# Patient Record
Sex: Female | Born: 1970
Health system: Southern US, Community
[De-identification: ages and names within clinical notes are randomized; demographics above are authoritative.]

## PROBLEM LIST (undated history)

## (undated) DIAGNOSIS — F17200 Nicotine dependence, unspecified, uncomplicated: Secondary | ICD-10-CM

## (undated) DIAGNOSIS — E1142 Type 2 diabetes mellitus with diabetic polyneuropathy: Secondary | ICD-10-CM

## (undated) DIAGNOSIS — E785 Hyperlipidemia, unspecified: Secondary | ICD-10-CM

## (undated) DIAGNOSIS — F1021 Alcohol dependence, in remission: Secondary | ICD-10-CM

## (undated) DIAGNOSIS — E118 Type 2 diabetes mellitus with unspecified complications: Secondary | ICD-10-CM

## (undated) HISTORY — DX: Type 2 diabetes mellitus with diabetic polyneuropathy: E11.42

## (undated) HISTORY — DX: Type 2 diabetes mellitus with unspecified complications: E11.8

## (undated) HISTORY — DX: Hyperlipidemia, unspecified: E78.5

## (undated) HISTORY — DX: Alcohol dependence, in remission: F10.21

## (undated) HISTORY — DX: Nicotine dependence, unspecified, uncomplicated: F17.200

## (undated) HISTORY — PX: OTHER SURGICAL HISTORY: SHX169

---

## 2007-03-10 ENCOUNTER — Emergency Department (HOSPITAL_COMMUNITY): Admission: EM | Admit: 2007-03-10 | Discharge: 2007-03-10 | Payer: Self-pay | Admitting: Emergency Medicine

## 2007-03-10 ENCOUNTER — Ambulatory Visit: Payer: Self-pay | Admitting: Psychiatry

## 2007-03-10 ENCOUNTER — Inpatient Hospital Stay (HOSPITAL_COMMUNITY): Admission: RE | Admit: 2007-03-10 | Discharge: 2007-03-17 | Payer: Self-pay | Admitting: Psychiatry
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
## 2014-03-21 ENCOUNTER — Emergency Department: Payer: Self-pay | Admitting: Internal Medicine

## 2014-03-21 LAB — COMPREHENSIVE METABOLIC PANEL
ALBUMIN: 4.3 g/dL (ref 3.4–5.0)
ALK PHOS: 92 U/L
Anion Gap: 3 — ABNORMAL LOW (ref 7–16)
BUN: 22 mg/dL — ABNORMAL HIGH (ref 7–18)
Bilirubin,Total: 0.4 mg/dL (ref 0.2–1.0)
Calcium, Total: 9.6 mg/dL (ref 8.5–10.1)
Chloride: 102 mmol/L (ref 98–107)
Co2: 27 mmol/L (ref 21–32)
Creatinine: 0.73 mg/dL (ref 0.60–1.30)
EGFR (Non-African Amer.): >60
Glucose: 292 mg/dL — ABNORMAL HIGH (ref 65–99)
Osmolality: 279 (ref 275–301)
POTASSIUM: 4.4 mmol/L (ref 3.5–5.1)
SGOT(AST): 23 U/L (ref 15–37)
SGPT (ALT): 19 U/L (ref 12–78)
Sodium: 132 mmol/L — ABNORMAL LOW (ref 136–145)
Total Protein: 7.9 g/dL (ref 6.4–8.2)

## 2014-03-21 LAB — DRUG SCREEN, URINE

## 2014-03-21 LAB — CBC
HCT: 43.6 % (ref 35.0–47.0)
HGB: 15 g/dL (ref 12.0–16.0)
MCH: 31.6 pg (ref 26.0–34.0)
MCHC: 34.4 g/dL (ref 32.0–36.0)
MCV: 92 fL (ref 80–100)
Platelet: 164 10*3/uL (ref 150–440)
RBC: 4.74 10*6/uL (ref 3.80–5.20)
RDW: 12.1 % (ref 11.5–14.5)
WBC: 11.1 10*3/uL — ABNORMAL HIGH (ref 3.6–11.0)

## 2014-03-21 LAB — URINALYSIS, COMPLETE
BILIRUBIN, UR: NEGATIVE
Blood: NEGATIVE
Ketone: NEGATIVE
Leukocyte Esterase: NEGATIVE
NITRITE: NEGATIVE
PH: 5 (ref 4.5–8.0)
Protein: NEGATIVE
RBC,UR: <1 /HPF (ref 0–5)
SQUAMOUS EPITHELIAL: NONE SEEN
Specific Gravity: 1.015 (ref 1.003–1.030)
WBC UR: <1 /HPF (ref 0–5)

## 2014-03-21 LAB — ETHANOL: Ethanol: <3 mg/dL

## 2014-03-21 LAB — SALICYLATE LEVEL: Salicylates, Serum: 4.8 mg/dL — ABNORMAL HIGH

## 2014-03-21 LAB — HEMOGLOBIN A1C: HEMOGLOBIN A1C: 9.9 % — AB (ref 4.2–6.3)

## 2014-03-21 LAB — ACETAMINOPHEN LEVEL

## 2015-02-15 NOTE — Consult Note (Signed)
PATIENT NAME:  Ashley Mcgee, [name] MR#:  [id number] DATE OF BIRTH:  September 02, 1971  DATE OF CONSULTATION:  03/22/2014  REFERRING PHYSICIAN:   CONSULTING PHYSICIAN:  Audery AmelJohn T. Nel Stoneking, MD  IDENTIFYING INFORMATION AND CHIEF COMPLAINT: A 44 year old woman who was brought to the Emergency Room by sheriff. She was petitioned by crisis services in the community because of suicidal ideation. The patient's chief complaint, "I've been depressed."   HISTORY OF PRESENT ILLNESS: The patient states that she has been feeling stressed out for many months. She does not have a job. Has major financial problems. Lives with her sister and feels like she is a burden and that she is not treated very well. Her mood stays down much of the time. Feels tired and worn out. She says that she has occasionally had passive suicidal thoughts but did not have any intent or plan to act on them. She denies that she is abusing drugs or alcohol. She has a history of alcohol dependence but has been sober now for an extended period of time without relapse. She contacted local mental health agencies seeking assistance. She was disappointed that they filed a commitment petition, saying that she would have been happy to come into the hospital by herself.   PAST PSYCHIATRIC HISTORY: Positive past history of alcohol dependence, but she is now maintaining extended sobriety. She had been prescribed medications in the past, including Celexa and Wellbutrin. She has made suicide attempts in the past, but the most recent one was 2004. Has not been hospitalized since that time. She denies any history of psychotic disorder.   SOCIAL HISTORY: No children. Not currently working. Lives with her sister. Has tried applying for disability, so far without success.   FAMILY HISTORY: Positive for depression.   MEDICAL HISTORY: Has diabetes. Does not take any medication, and her blood sugars have been out of control. She does not have any money or insurance and has not  accessed any care for it.   REVIEW OF SYSTEMS: Recent depressed mood, fatigue, feeling sick, feeling run down. Had occasional passive suicidal thoughts without any intent or plan. No psychotic symptoms. The rest of the review of systems unremarkable.   MENTAL STATUS EXAM: Slightly disheveled woman, looks her stated age, cooperative with the interview. Eye contact good. Psychomotor activity appropriate. Speech normal rate, tone and volume. Affect is expressive but appropriate, not out of control, not overly depressed. Mood stated as being okay. Thoughts are lucid. No loosening of associations. No sign of delusions. Denies auditory or visual hallucinations. Denies suicidal or homicidal ideation. Judgment and insight good. Short- and long-term memory intact. Alert and oriented x 4.   CURRENT MEDICATIONS: She was not taking any medications at all at home. Here in the hospital, she has been given metformin 500 mg twice a day and sliding scale insulin just because her blood sugar is so high.   ALLERGIES: No known drug allergies.   LABORATORY RESULTS: Blood sugars have been running up in the 300s since she came in. Her hemoglobin A1c is 9.9. Acetaminophen negative. Salicylates just slightly elevated but not toxic. Alcohol negative. Chemistry panel, besides the glucose, showed a low sodium part of which may be artifactual, elevated BUN at 22. CBC slightly elevated white count at 11.1. Urinalysis positive for glucose, otherwise unremarkable. Drug screen negative.   ASSESSMENT: A 44 year old woman who probably has a major depression, although her situation is very stressful and her diabetes is probably contributing to her mood. She is lucid and not psychotic.  She did not act on any thoughts of hurting herself. She currently denies any suicidal intent or plan. She is very enthusiastic and cooperative with the idea of going for outpatient mental health treatment. The patient does not require hospitalization and does  not meet commitment criteria.   TREATMENT PLAN: Discontinue involuntary commitment. The patient has been referred to [facility] and advised to go Monday morning for an evaluation for therapy and medication management there. She will be encouraged to go seek out what medical care there may be available from a clinic in the area because of her glucose.   DIAGNOSIS, PRINCIPAL AND PRIMARY:  AXIS I: Depression, not otherwise specified.   SECONDARY DIAGNOSES:  AXIS I: Alcohol dependence, in sustained remission.  AXIS II: Deferred.  AXIS III: Diabetes mellitus.  AXIS IV: Severe from financial problems.  AXIS V: Functioning at time of discharge 50.    ____________________________ Audery Amel, MD jtc:gb D: 03/22/2014 18:48:43 ET T: 03/22/2014 19:21:16 ET JOB#: [id number]  cc: Audery Amel, MD, <Dictator> Audery Amel MD ELECTRONICALLY SIGNED 04/10/2014 13:19

## 2015-09-16 ENCOUNTER — Emergency Department (INDEPENDENT_AMBULATORY_CARE_PROVIDER_SITE_OTHER)
Admission: EM | Admit: 2015-09-16 | Discharge: 2015-09-16 | Disposition: A | Discharge status: Home / Self Care | Payer: BLUE CROSS/BLUE SHIELD | Source: Home / Self Care

## 2015-09-16 ENCOUNTER — Encounter (HOSPITAL_COMMUNITY): Payer: Self-pay | Admitting: *Deleted

## 2015-09-16 DIAGNOSIS — J40 Bronchitis, not specified as acute or chronic: Secondary | ICD-10-CM | POA: Diagnosis not present

## 2015-09-16 MED ORDER — AZITHROMYCIN 250 MG PO TABS: 250.0000 mg | ORAL_TABLET | Freq: Every day | ORAL | Status: DC

## 2015-09-16 NOTE — ED Notes (Signed)
Pt  Is  A  Smoker     Reports  Coughing      As   Well   Some  Tightness  In  Her  Chest         Pt  Reports  Symptoms  For  About  1  Week       Pt  Sitting  Upright on the  Exam table  Speaking in  Complete  sentances

## 2015-09-16 NOTE — ED Provider Notes (Signed)
CSN: [id number]     Arrival date & time 09/16/15  1606 History   None    Chief Complaint  Patient presents with  . Cough   (Consider location/radiation/quality/duration/timing/severity/associated sxs/prior Treatment) HPI History obtained from patient:   LOCATION: Upper respiratory SEVERITY: No pain DURATION: Several days CONTEXT: Sudden onset due to exposure of sick people at work. QUALITY: Similar to previous bronchitis MODIFYING FACTORS: Over-the-counter medications without relief of symptoms ASSOCIATED SYMPTOMS: Cough sputum production or stiffness TIMING: Constant OCCUPATION: Telephone technical support  Past Medical History  Diagnosis Date  . Diabetes mellitus without complication (HCC)    History reviewed. No pertinent past surgical history. History reviewed. No pertinent family history. Social History  Substance Use Topics  . Smoking status: Current Every Day Smoker  . Smokeless tobacco: None  . Alcohol Use: No   OB History    No data available     Review of Systems ROS +'ve cough congestion  Denies: HEADACHE, NAUSEA, ABDOMINAL PAIN, CHEST PAIN, DYSURIA, SHORTNESS OF BREATH  Allergies  Review of patient's allergies indicates no known allergies.  Home Medications   Prior to Admission medications   Medication Sig Start Date End Date Taking? Authorizing Provider  azithromycin (ZITHROMAX) 250 MG tablet Take 1 tablet (250 mg total) by mouth daily. Take first 2 tablets together, then 1 every day until finished. 09/16/15   Tharon AquasFrank C Saachi Zale, PA   Meds Ordered and Administered this Visit  Medications - No data to display  BP 130/83 mmHg  Pulse 89  Temp(Src) 97.8 F (36.6 C) (Oral)  Resp 16  SpO2 99% No data found.   Physical Exam NURSES NOTES AND VITAL SIGNS REVIEWED. CONSTITUTIONAL: Well developed, well nourished, no acute distress HEENT: normocephalic, atraumatic EYES: Conjunctiva normal NECK:normal ROM, supple PULMONARY:No respiratory distress,  normal effort, Lungs: CTAb/l CARDIOVASCULAR: RRR, no murmur ABDOMEN: soft, ND, NT, +'ve BS MUSCULOSKELETAL: Normal ROM of all extremities SKIN: warm and dry without rash PSYCHIATRIC: Mood and affect normal  ED Course  Procedures (including critical care time)  Labs Review Labs Reviewed - No data to display  Imaging Review No results found.   Visual Acuity Review  Right Eye Distance:   Left Eye Distance:   Bilateral Distance:    Right Eye Near:   Left Eye Near:    Bilateral Near:         MDM   1. Bronchitis    Patient states that she would like to have an antibiotic. She admits to smoking approximately one pack of cigarettes a day. She states that in the past she is never required steroids or bronchodilators. She has had no hospitalizations for COPD or intubations or ICU level care. Patient is also requesting a return to work note. Prescription for azithromycin as prescribed. Instructions of care provided discharged home in stable condition.    Tharon AquasFrank C Benisha Hadaway, PA 09/16/15 1757

## 2015-09-16 NOTE — Discharge Instructions (Signed)
Upper Respiratory Infection, Adult °Most upper respiratory infections (URIs) are a viral infection of the air passages leading to the lungs. A URI affects the nose, throat, and upper air passages. The most common type of URI is nasopharyngitis and is typically referred to as "the common cold." °URIs run their course and usually go away on their own. Most of the time, a URI does not require medical attention, but sometimes a bacterial infection in the upper airways can follow a viral infection. This is called a secondary infection. Sinus and middle ear infections are common types of secondary upper respiratory infections. °Bacterial pneumonia can also complicate a URI. A URI can worsen asthma and chronic obstructive pulmonary disease (COPD). Sometimes, these complications can require emergency medical care and may be life threatening.  °CAUSES °Almost all URIs are caused by viruses. A virus is a type of germ and can spread from one person to another.  °RISKS FACTORS °You may be at risk for a URI if:  °· You smoke.   °· You have chronic heart or lung disease. °· You have a weakened defense (immune) system.   °· You are very young or very old.   °· You have nasal allergies or asthma. °· You work in crowded or poorly ventilated areas. °· You work in health care facilities or schools. °SIGNS AND SYMPTOMS  °Symptoms typically develop 2-3 days after you come in contact with a cold virus. Most viral URIs last 7-10 days. However, viral URIs from the influenza virus (flu virus) can last 14-18 days and are typically more severe. Symptoms may include:  °· Runny or stuffy (congested) nose.   °· Sneezing.   °· Cough.   °· Sore throat.   °· Headache.   °· Fatigue.   °· Fever.   °· Loss of appetite.   °· Pain in your forehead, behind your eyes, and over your cheekbones (sinus pain). °· Muscle aches.   °DIAGNOSIS  °Your health care provider may diagnose a URI by: °· Physical exam. °· Tests to check that your symptoms are not due to  another condition such as: °· Strep throat. °· Sinusitis. °· Pneumonia. °· Asthma. °TREATMENT  °A URI goes away on its own with time. It cannot be cured with medicines, but medicines may be prescribed or recommended to relieve symptoms. Medicines may help: °· Reduce your fever. °· Reduce your cough. °· Relieve nasal congestion. °HOME CARE INSTRUCTIONS  °· Take medicines only as directed by your health care provider.   °· Gargle warm saltwater or take cough drops to comfort your throat as directed by your health care provider. °· Use a warm mist humidifier or inhale steam from a shower to increase air moisture. This may make it easier to breathe. °· Drink enough fluid to keep your urine clear or pale yellow.   °· Eat soups and other clear broths and maintain good nutrition.   °· Rest as needed.   °· Return to work when your temperature has returned to normal or as your health care provider advises. You may need to stay home longer to avoid infecting others. You can also use a face mask and careful hand washing to prevent spread of the virus. °· Increase the usage of your inhaler if you have asthma.   °· Do not use any tobacco products, including cigarettes, chewing tobacco, or electronic cigarettes. If you need help quitting, ask your health care provider. °PREVENTION  °The best way to protect yourself from getting a cold is to practice good hygiene.  °· Avoid oral or hand contact with people with cold   symptoms.   °· Wash your hands often if contact occurs.   °There is no clear evidence that vitamin C, vitamin E, echinacea, or exercise reduces the chance of developing a cold. However, it is always recommended to get plenty of rest, exercise, and practice good nutrition.  °SEEK MEDICAL CARE IF:  °· You are getting worse rather than better.   °· Your symptoms are not controlled by medicine.   °· You have chills. °· You have worsening shortness of breath. °· You have brown or red mucus. °· You have yellow or brown nasal  discharge. °· You have pain in your face, especially when you bend forward. °· You have a fever. °· You have swollen neck glands. °· You have pain while swallowing. °· You have white areas in the back of your throat. °SEEK IMMEDIATE MEDICAL CARE IF:  °· You have severe or persistent: °¨ Headache. °¨ Ear pain. °¨ Sinus pain. °¨ Chest pain. °· You have chronic lung disease and any of the following: °¨ Wheezing. °¨ Prolonged cough. °¨ Coughing up blood. °¨ A change in your usual mucus. °· You have a stiff neck. °· You have changes in your: °¨ Vision. °¨ Hearing. °¨ Thinking. °¨ Mood. °MAKE SURE YOU:  °· Understand these instructions. °· Will watch your condition. °· Will get help right away if you are not doing well or get worse. °  °This information is not intended to replace advice given to you by your health care provider. Make sure you discuss any questions you have with your health care provider. °  °Document Released: 04/06/2001 Document Revised: 02/25/2015 Document Reviewed: 01/16/2014 °Elsevier Interactive Patient Education ©2016 [employer] °Acute Bronchitis °Bronchitis is when the airways that extend from the windpipe into the lungs get red, puffy, and painful (inflamed). Bronchitis often causes thick spit (mucus) to develop. This leads to a cough. A cough is the most common symptom of bronchitis. °In acute bronchitis, the condition usually begins suddenly and goes away over time (usually in 2 weeks). Smoking, allergies, and asthma can make bronchitis worse. Repeated episodes of bronchitis may cause more lung problems. °HOME CARE °· Rest. °· Drink enough fluids to keep your pee (urine) clear or pale yellow (unless you need to limit fluids as told by your doctor). °· Only take over-the-counter or prescription medicines as told by your doctor. °· Avoid smoking and secondhand smoke. These can make bronchitis worse. If you are a smoker, think about using nicotine gum or skin patches. Quitting smoking will help  your lungs heal faster. °· Reduce the chance of getting bronchitis again by: °¨ Washing your hands often. °¨ Avoiding people with cold symptoms. °¨ Trying not to touch your hands to your mouth, nose, or eyes. °· Follow up with your doctor as told. °GET HELP IF: °Your symptoms do not improve after 1 week of treatment. Symptoms include: °· Cough. °· Fever. °· Coughing up thick spit. °· Body aches. °· Chest congestion. °· Chills. °· Shortness of breath. °· Sore throat. °GET HELP RIGHT AWAY IF:  °· You have an increased fever. °· You have chills. °· You have severe shortness of breath. °· You have bloody thick spit (sputum). °· You throw up (vomit) often. °· You lose too much body fluid (dehydration). °· You have a severe headache. °· You faint. °MAKE SURE YOU:  °· Understand these instructions. °· Will watch your condition. °· Will get help right away if you are not doing well or get worse. °  °This information is   not intended to replace advice given to you by your health care provider. Make sure you discuss any questions you have with your health care provider. °  °Document Released: 03/29/2008 Document Revised: 06/13/2013 Document Reviewed: 04/03/2013 °Elsevier Interactive Patient Education ©2016 [employer] ° °

## 2018-07-14 ENCOUNTER — Encounter: Payer: Self-pay | Admitting: Family Medicine

## 2018-07-14 ENCOUNTER — Ambulatory Visit: Payer: Medicaid Other | Attending: Family Medicine | Admitting: Family Medicine

## 2018-07-14 VITALS — BP 113/69 | HR 82 | Temp 98.3°F | Ht 70.0 in | Wt 209.0 lb

## 2018-07-14 DIAGNOSIS — Z8249 Family history of ischemic heart disease and other diseases of the circulatory system: Secondary | ICD-10-CM | POA: Diagnosis not present

## 2018-07-14 DIAGNOSIS — Z23 Encounter for immunization: Secondary | ICD-10-CM | POA: Diagnosis not present

## 2018-07-14 DIAGNOSIS — Z79899 Other long term (current) drug therapy: Secondary | ICD-10-CM | POA: Diagnosis not present

## 2018-07-14 DIAGNOSIS — E119 Type 2 diabetes mellitus without complications: Secondary | ICD-10-CM | POA: Diagnosis present

## 2018-07-14 DIAGNOSIS — E785 Hyperlipidemia, unspecified: Secondary | ICD-10-CM | POA: Diagnosis not present

## 2018-07-14 DIAGNOSIS — I10 Essential (primary) hypertension: Secondary | ICD-10-CM

## 2018-07-14 DIAGNOSIS — E138 Other specified diabetes mellitus with unspecified complications: Secondary | ICD-10-CM

## 2018-07-14 DIAGNOSIS — F17210 Nicotine dependence, cigarettes, uncomplicated: Secondary | ICD-10-CM | POA: Diagnosis not present

## 2018-07-14 DIAGNOSIS — E1142 Type 2 diabetes mellitus with diabetic polyneuropathy: Secondary | ICD-10-CM

## 2018-07-14 DIAGNOSIS — Z833 Family history of diabetes mellitus: Secondary | ICD-10-CM | POA: Insufficient documentation

## 2018-07-14 LAB — POCT GLYCOSYLATED HEMOGLOBIN (HGB A1C): HbA1c, POC (controlled diabetic range): 5.6 % (ref 0.0–7.0)

## 2018-07-14 LAB — GLUCOSE, POCT (MANUAL RESULT ENTRY): POC Glucose: 192 mg/dL — AB (ref 70–99)

## 2018-07-14 MED ORDER — ATORVASTATIN CALCIUM 10 MG PO TABS: 10.0000 mg | ORAL_TABLET | Freq: Every day | ORAL | 3 refills | Status: DC

## 2018-07-14 MED ORDER — ACCU-CHEK AVIVA DEVI: 0 refills | Status: AC

## 2018-07-14 MED ORDER — METFORMIN HCL 1000 MG PO TABS: 1000.0000 mg | ORAL_TABLET | Freq: Two times a day (BID) | ORAL | 6 refills | Status: DC

## 2018-07-14 MED ORDER — GABAPENTIN 300 MG PO CAPS: 300.0000 mg | ORAL_CAPSULE | Freq: Three times a day (TID) | ORAL | 11 refills | Status: DC

## 2018-07-14 MED ORDER — GLUCOSE BLOOD VI STRP: ORAL_STRIP | 12 refills | Status: DC

## 2018-07-14 MED ORDER — LISINOPRIL 2.5 MG PO TABS: 2.5000 mg | ORAL_TABLET | Freq: Every day | ORAL | 6 refills | Status: DC

## 2018-07-14 MED ORDER — ACCU-CHEK SOFT TOUCH LANCETS MISC: 12 refills | Status: DC

## 2018-07-14 MED ORDER — GLIPIZIDE 10 MG PO TABS: 10.0000 mg | ORAL_TABLET | Freq: Two times a day (BID) | ORAL | 6 refills | Status: DC

## 2018-07-14 MED FILL — metFORMIN HCL 1000 MG TABS: 1000 | 30 days supply | Qty: 60 | Fill #0

## 2018-07-14 MED FILL — ATORVASTATIN 10 MG TABLET: 10 | 30 days supply | Qty: 30 | Fill #0

## 2018-07-14 MED FILL — GABAPENTIN 300 MG CAPSULE: 300 | 30 days supply | Qty: 90 | Fill #0

## 2018-07-14 MED FILL — glipiZIDE 10 MG TABS: 10 | 30 days supply | Qty: 60 | Fill #0

## 2018-07-14 MED FILL — ACCU-CHEK AVIVA PLUS W/DEVI: W/DEVICE | 30 days supply | Qty: 1 | Fill #0

## 2018-07-14 MED FILL — LISINOPRIL 2.5 MG TABLET: 2.5 | 30 days supply | Qty: 30 | Fill #0

## 2018-07-14 MED FILL — ACCU-CHEK AVIVA PLUS TEST S: 30 days supply | Qty: 100 | Fill #0

## 2018-07-14 MED FILL — ACCU-CHEK SOFTCLIX LANCETS: 30 days supply | Qty: 100 | Fill #0

## 2018-07-14 NOTE — Progress Notes (Signed)
Subjective:    Patient ID: Ashley AmassKristin Arya, female    DOB: 04/28/1971, 47 y.o.   MRN: [id number]  HPI 47 year old female who was seen to establish care for chronic medical issues including diabetes, hyperlipidemia and hypertension but patient states that she also sometimes becomes orthostatic.  Patient reports a history of alcohol abuse/alcoholism but states that she has been in recovery for the past 2 years.  Patient states that she was previously receiving services from a family medicine practitioner at family services but could no longer be seen there when she obtained insurance/Medicaid but is still followed by services for counseling and psychiatric services.  Patient states that she does check her blood sugars does need a new glucometer and supplies.  Patient states that her blood sugars are generally in the low 100s fasting and she considers him to be well controlled at this time.  Patient however does have peripheral neuropathy with burning and stinging as well as numbness from her lower legs above the ankles as well as her feet and toes.  Patient does continue to smoke about half a pack of cigarettes daily and has been smoking since the age of 47.  Patient denies any past surgical history.  Patient has had no allergic reactions to medications, foods or insects.  Patient reports a family history significant for her mother, father and both sets of grandparents with diabetes, hypertension and hyperlipidemia.  No family history of heart disease, stroke or cancers.      Patient reports that she feels well today.  Patient is currently on metformin and glipizide for control of her diabetes.  Patient does not have any issues with abdominal pain or diarrhea related to the use of these medication patient is also on gabapentin to help with neuropathic pain.  Patient is on a low dose of lisinopril for blood pressure.  Patient also takes atorvastatin for hyperlipidemia and denies any increased muscle aches with  the use of this medication.  Patient states she does have some fatigue.  Patient denies pain or palpitations.  Patient denies any current issues with shortness of breath or cough.  Patient reported no surgical history but later stated that she has had prior removal of cataracts.    Review of Systems  Constitutional: Negative for chills and fever.  HENT: Negative for congestion, sore throat and trouble swallowing.   Respiratory: Negative for cough and shortness of breath.   Cardiovascular: Negative for chest pain, palpitations and leg swelling.  Gastrointestinal: Negative for abdominal pain and nausea.  Genitourinary: Negative for dysuria and frequency.  Musculoskeletal: Negative for back pain and gait problem.  Neurological: Positive for numbness. Negative for dizziness and headaches.       Objective:   Physical Exam  Constitutional: She appears well-developed and well-nourished. No distress.  Neck: Normal range of motion. Neck supple. No thyromegaly present.  Cardiovascular: Normal rate and regular rhythm.  Pulses:      Dorsalis pedis pulses are 1+ on the right side, and 1+ on the left side.       Posterior tibial pulses are 1+ on the right side, and 1+ on the left side.  Pulmonary/Chest: Effort normal and breath sounds normal. She has no wheezes.  Abdominal: Soft. There is no tenderness.  Musculoskeletal: She exhibits no edema or tenderness.       Right foot: There is deformity (Hammertoe deformities). There is normal range of motion.       Left foot: There is deformity (Hammertoe deformities). There  is normal range of motion.  Feet:  Right Foot:  Protective Sensation: 10 sites tested. 2 sites sensed.  Skin Integrity: Negative for ulcer, blister, skin breakdown, erythema, warmth, callus or dry skin.  Left Foot:  Protective Sensation: 10 sites tested. 1 site sensed. Skin Integrity: Negative for ulcer, blister, skin breakdown, erythema, warmth, callus or dry skin.    Lymphadenopathy:    She has no cervical adenopathy.   BP 113/69   Pulse 82   Temp 98.3 F (36.8 C) (Oral)   Ht 5\' 10"  (1.778 m)   Wt 209 lb (94.8 kg)   SpO2 100%   BMI 29.99 kg/m Vital signs and nurse's notes reviewed      Assessment & Plan:  1. Diabetes mellitus of other type with complication, unspecified whether long term insulin use (HCC) Patient will have glucose and hemoglobin A1c done at today's visit.  Patient is provided with refills of her metformin and Glucotrol.  Patient will have CMP, TSH and urine microalbumin done at today's visit.  Patient states that her eye exam is up-to-date.  Patient is encouraged to monitor her blood sugars and new glucometer with twice daily supplies provided. - POCT glucose (manual entry) - POCT glycosylated hemoglobin (Hb A1C) - glipiZIDE (GLUCOTROL) 10 MG tablet; Take 1 tablet (10 mg total) by mouth 2 (two) times daily before a meal.  Dispense: 60 tablet; Refill: 6 - metFORMIN (GLUCOPHAGE) 1000 MG tablet; Take 1 tablet (1,000 mg total) by mouth 2 (two) times daily with a meal.  Dispense: 60 tablet; Refill: 6 - Comprehensive metabolic panel - TSH - Microalbumin/Creatinine Ratio, Urine - Blood Glucose Monitoring Suppl (ACCU-CHEK AVIVA) device; Use as instructed  Dispense: 1 each; Refill: 0 - glucose blood (ACCU-CHEK AVIVA) test strip; Use as instructed to check blood sugar 2-3 times per day  Dispense: 100 each; Refill: 12 - Lancets (ACCU-CHEK SOFT TOUCH) lancets; Use as instructed  Dispense: 100 each; Refill: 12  2. Diabetic polyneuropathy associated with type 2 diabetes mellitus (HCC) Patient with abnormal monofilament exam with no sensation on most of the areas of both feet.  Patient also reports neuropathic pain with burning and stinging.  Patient is provided with refill of gabapentin - gabapentin (NEURONTIN) 300 MG capsule; Take 1 capsule (300 mg total) by mouth 3 (three) times daily.  Dispense: 90 capsule; Refill: 11  3. Essential  hypertension Patient reports fluctuations in her blood pressure and states that she does have hypertension but occasionally becomes orthostatic.  Patient will continue use of low-dose lisinopril but also provide renal protection from the effects of diabetes. - lisinopril (PRINIVIL,ZESTRIL) 2.5 MG tablet; Take 1 tablet (2.5 mg total) by mouth daily.  Dispense: 30 tablet; Refill: 6  4. Hyperlipidemia, unspecified hyperlipidemia type Refill provided for atorvastatin as patient reports history of hyperlipidemia.  Patient is nonfasting at today's visit at home as results are not available.  Patient will hopefully be able to have fasting lipid panel before the end of the year/at her next visit. - atorvastatin (LIPITOR) 10 MG tablet; Take 1 tablet (10 mg total) by mouth daily.  Dispense: 90 tablet; Refill: 3  5. Encounter for long-term (current) use of medications Patient will have CMP and CBC done in follow-up of long-term use of medications for treatment of blood pressure, hypertension and hyperlipidemia patient is also on some psychiatric medications which can be considered high risk - Comprehensive metabolic panel - CBC with Differential  6. Need for immunization against influenza Patient was offered and agreed to  have influenza immunization at today's visit.  Information regarding influenza immunization was also provided - Flu Vaccine QUAD 36+ mos IM  *Patient did have some issues with resources such as transportation.  Patient states that she is a recovering alcoholic but has difficulty attending AA meetings secondary to issues with transportation.  Social worker was informed of patient's issues and will follow up patient for consultation as she was not able to see patient while she was here for today's visit.  An After Visit Summary was printed and given to the patient.  Return in about 4 months (around 11/13/2018) for and as needed.

## 2018-07-15 LAB — COMPREHENSIVE METABOLIC PANEL WITH GFR
ALT: 9 IU/L (ref 0–32)
AST: 10 IU/L (ref 0–40)
Albumin/Globulin Ratio: 1.9 (ref 1.2–2.2)
Albumin: 4.3 g/dL (ref 3.5–5.5)
Alkaline Phosphatase: 61 IU/L (ref 39–117)
BUN/Creatinine Ratio: 15 (ref 9–23)
BUN: 11 mg/dL (ref 6–24)
Bilirubin Total: 0.2 mg/dL (ref 0.0–1.2)
CO2: 24 mmol/L (ref 20–29)
Calcium: 9.4 mg/dL (ref 8.7–10.2)
Chloride: 91 mmol/L — ABNORMAL LOW (ref 96–106)
Creatinine, Ser: 0.74 mg/dL (ref 0.57–1.00)
GFR calc Af Amer: 110 mL/min/1.73
GFR calc non Af Amer: 95 mL/min/1.73
Globulin, Total: 2.3 g/dL (ref 1.5–4.5)
Glucose: 149 mg/dL — ABNORMAL HIGH (ref 65–99)
Potassium: 5.4 mmol/L — ABNORMAL HIGH (ref 3.5–5.2)
Sodium: 130 mmol/L — ABNORMAL LOW (ref 134–144)
Total Protein: 6.6 g/dL (ref 6.0–8.5)

## 2018-07-15 LAB — CBC WITH DIFFERENTIAL/PLATELET
Basophils Absolute: 0.1 x10E3/uL (ref 0.0–0.2)
Basos: 1 %
EOS (ABSOLUTE): 0.1 x10E3/uL (ref 0.0–0.4)
Eos: 1 %
Hematocrit: 33.9 % — ABNORMAL LOW (ref 34.0–46.6)
Hemoglobin: 11.5 g/dL (ref 11.1–15.9)
Immature Grans (Abs): 0 x10E3/uL (ref 0.0–0.1)
Immature Granulocytes: 0 %
Lymphocytes Absolute: 2.4 x10E3/uL (ref 0.7–3.1)
Lymphs: 32 %
MCH: 32.4 pg (ref 26.6–33.0)
MCHC: 33.9 g/dL (ref 31.5–35.7)
MCV: 96 fL (ref 79–97)
Monocytes Absolute: 0.4 x10E3/uL (ref 0.1–0.9)
Monocytes: 5 %
Neutrophils Absolute: 4.6 x10E3/uL (ref 1.4–7.0)
Neutrophils: 61 %
Platelets: 265 x10E3/uL (ref 150–450)
RBC: 3.55 x10E6/uL — ABNORMAL LOW (ref 3.77–5.28)
RDW: 14.3 % (ref 12.3–15.4)
WBC: 7.5 x10E3/uL (ref 3.4–10.8)

## 2018-07-15 LAB — MICROALBUMIN / CREATININE URINE RATIO
Creatinine, Urine: 37.4 mg/dL
Microalb/Creat Ratio: <8 mg/g{creat} (ref 0.0–30.0)
Microalbumin, Urine: <3 ug/mL

## 2018-07-15 LAB — TSH: TSH: 1.16 u[IU]/mL (ref 0.450–4.500)

## 2018-07-16 ENCOUNTER — Other Ambulatory Visit: Payer: Self-pay | Admitting: Family Medicine

## 2018-07-16 DIAGNOSIS — E875 Hyperkalemia: Secondary | ICD-10-CM

## 2018-07-16 DIAGNOSIS — E871 Hypo-osmolality and hyponatremia: Secondary | ICD-10-CM

## 2018-07-16 NOTE — Progress Notes (Signed)
Patient with recent blood work showing sodium of 130 and potassium that was slightly increased at 5.4.  Patient will be contacted by staff to return for lab only visit for recheck of BMP week.

## 2018-07-18 ENCOUNTER — Telehealth: Payer: Self-pay | Admitting: Licensed Clinical Social Worker

## 2018-07-18 NOTE — Telephone Encounter (Signed)
Call placed to patient. LCSWA introduced self and [facility]. LCSWA disclosed that she received a consult from Dr. Jillyn HiddenFulp to address transportation barriers.   Pt shared that she has Medicaid and plans on registering for Medicaid Transportation next week. She currently has a case Production designer, theatre/television/filmmanager through [employer] who has agreed to take pt to her local DSS office. Pt reports [facility] are expected to end this month due to pt's inability to obtain employment.  LCSWA informed pt about [employer]. Pt believes that she qualifies for services. LCSWA agreed to mail application to pt's address on file. [facility]. She will inquire whether therapist will assist with application and submission.   LCSWA provided additional information regarding supportive resources [facility]. Pt was strongly encouraged to contact LCSWA with any questions or concerns that may arise in the future. No additional concerns noted.

## 2018-11-13 ENCOUNTER — Encounter: Payer: Self-pay | Admitting: Family Medicine

## 2018-11-13 ENCOUNTER — Ambulatory Visit: Payer: Medicaid Other | Attending: Family Medicine | Admitting: Family Medicine

## 2018-11-13 VITALS — BP 110/69 | HR 90 | Temp 98.6°F | Resp 18 | Ht 70.0 in | Wt 201.0 lb

## 2018-11-13 DIAGNOSIS — E871 Hypo-osmolality and hyponatremia: Secondary | ICD-10-CM

## 2018-11-13 DIAGNOSIS — E785 Hyperlipidemia, unspecified: Secondary | ICD-10-CM

## 2018-11-13 DIAGNOSIS — I10 Essential (primary) hypertension: Secondary | ICD-10-CM | POA: Diagnosis not present

## 2018-11-13 DIAGNOSIS — Z8249 Family history of ischemic heart disease and other diseases of the circulatory system: Secondary | ICD-10-CM | POA: Diagnosis not present

## 2018-11-13 DIAGNOSIS — Z7984 Long term (current) use of oral hypoglycemic drugs: Secondary | ICD-10-CM | POA: Insufficient documentation

## 2018-11-13 DIAGNOSIS — E1165 Type 2 diabetes mellitus with hyperglycemia: Secondary | ICD-10-CM

## 2018-11-13 DIAGNOSIS — F1021 Alcohol dependence, in remission: Secondary | ICD-10-CM | POA: Diagnosis not present

## 2018-11-13 DIAGNOSIS — F17210 Nicotine dependence, cigarettes, uncomplicated: Secondary | ICD-10-CM

## 2018-11-13 DIAGNOSIS — R8290 Unspecified abnormal findings in urine: Secondary | ICD-10-CM | POA: Diagnosis not present

## 2018-11-13 DIAGNOSIS — E875 Hyperkalemia: Secondary | ICD-10-CM

## 2018-11-13 DIAGNOSIS — E1142 Type 2 diabetes mellitus with diabetic polyneuropathy: Secondary | ICD-10-CM | POA: Diagnosis not present

## 2018-11-13 DIAGNOSIS — E119 Type 2 diabetes mellitus without complications: Secondary | ICD-10-CM | POA: Diagnosis present

## 2018-11-13 DIAGNOSIS — Z79899 Other long term (current) drug therapy: Secondary | ICD-10-CM

## 2018-11-13 DIAGNOSIS — R319 Hematuria, unspecified: Secondary | ICD-10-CM | POA: Diagnosis not present

## 2018-11-13 DIAGNOSIS — Z833 Family history of diabetes mellitus: Secondary | ICD-10-CM | POA: Insufficient documentation

## 2018-11-13 DIAGNOSIS — Z720 Tobacco use: Secondary | ICD-10-CM

## 2018-11-13 LAB — POCT URINALYSIS DIP (CLINITEK)
Bilirubin, UA: NEGATIVE
Glucose, UA: 500 mg/dL — AB
Ketones, POC UA: NEGATIVE mg/dL
Leukocytes, UA: NEGATIVE
Nitrite, UA: NEGATIVE
POC PROTEIN,UA: 100 — AB
Spec Grav, UA: 1.01
Urobilinogen, UA: 0.2 U/dL
pH, UA: 5

## 2018-11-13 LAB — POCT GLYCOSYLATED HEMOGLOBIN (HGB A1C): Hemoglobin A1C: 9.9 % — AB (ref 4.0–5.6)

## 2018-11-13 LAB — GLUCOSE, POCT (MANUAL RESULT ENTRY)
POC Glucose: 308 mg/dL — AB (ref 70–99)
POC Glucose: 308 mg/dL — AB (ref 70–99)

## 2018-11-13 MED ORDER — ATORVASTATIN CALCIUM 10 MG PO TABS: 10.0000 mg | ORAL_TABLET | Freq: Every day | ORAL | 1 refills | Status: DC

## 2018-11-13 MED ORDER — METFORMIN HCL 1000 MG PO TABS: 1000.0000 mg | ORAL_TABLET | Freq: Two times a day (BID) | ORAL | 1 refills | Status: DC

## 2018-11-13 MED ORDER — INSULIN ASPART 100 UNIT/ML ~~LOC~~ SOLN
5.0000 [IU] | Freq: Once | SUBCUTANEOUS | Status: AC
  Administered 2018-11-13: 5 [IU] via SUBCUTANEOUS

## 2018-11-13 MED ORDER — GLIPIZIDE 10 MG PO TABS: 10.0000 mg | ORAL_TABLET | Freq: Two times a day (BID) | ORAL | 1 refills | Status: DC

## 2018-11-13 MED ORDER — LISINOPRIL 2.5 MG PO TABS: 2.5000 mg | ORAL_TABLET | Freq: Every day | ORAL | 1 refills | Status: DC

## 2018-11-13 MED ORDER — GABAPENTIN 300 MG PO CAPS: 300.0000 mg | ORAL_CAPSULE | Freq: Three times a day (TID) | ORAL | 1 refills | Status: DC

## 2018-11-13 MED FILL — metFORMIN HCL 1000 MG TABS: 1000 | 30 days supply | Qty: 60 | Fill #0

## 2018-11-13 MED FILL — LISINOPRIL 2.5 MG TABLET: 2.5 | 30 days supply | Qty: 30 | Fill #0

## 2018-11-13 MED FILL — ATORVASTATIN 10 MG TABLET: 10 | 30 days supply | Qty: 30 | Fill #0

## 2018-11-13 MED FILL — GABAPENTIN 300 MG CAPSULE: 300 | 30 days supply | Qty: 90 | Fill #0

## 2018-11-13 MED FILL — glipiZIDE 10 MG TABS: 10 | 30 days supply | Qty: 60 | Fill #0

## 2018-11-13 NOTE — Progress Notes (Signed)
Established Patient Office Visit  Subjective:  Patient ID: Ashley Mcgee, female    DOB: 10/03/1971  Age: 48 y.o. MRN: [id number]  CC:  Chief Complaint  Patient presents with  . Follow-up    DM    HPI Ashley Mcgee presents for follow-up of and continued management of chronic medical issues including type 2 diabetes, hyperlipidemia and history of hypertension complicated by occasional hypotension. Patient also with history of alcohol abuse which is currently in remission.  Patient also with painful peripheral neuropathy.  Patient also with a history of mental health issues for which she is also receiving care by another provider.  Patient also with tobacco use and continues to smoke.  At her last visit on 07/14/2018, patient's hemoglobin A1c was 5.6 and glucose on BMP was 149.  Patient also had elevated creatinine at 5.2 and hyponatremia with a sodium of 130.  Patient did not return for repeat labs as requested.  Patient also with normal CBC and TSH at her last visit.      At today's visit, patient states that she has been out of her diabetes medications for about a week but later states that she has difficulty with time and therefore it could have been 2 weeks or longer that she has been out of her diabetes medicine.  Patient reports that she continues to follow-up with family health services for mental health disorders.  Patient reports that she has been diagnosed with bipolar disorder and depression.  Patient feels that these issues are very well controlled on her current medications.  Patient denies any headaches or dizziness related to her blood pressure.  Patient denies shortness of breath or cough.  Patient continues to smoke approximately 1/2 pack/day of cigarettes.  Patient reports that she is not sure if she can stop smoking as patient used patches in the past without success.  Patient continues to have painful peripheral neuropathy in her feet and lower legs.  Patient states the  sensation is similar to what happens when your feet fall asleep.  Patient has painful numbness and tingling that is about a 7 on a 0-to-10 scale but patient states that this is improved with the use of gabapentin.  Patient states that she needs refills of all medications at today's visit.  Patient denies any muscle aches related to her use of cholesterol medication.  Patient is fasting at today's visit.  Past Medical History:  Diagnosis Date  . Alcohol dependence in remission (HCC)   . Diabetic peripheral neuropathy (HCC)   . Hyperlipidemia   . Tobacco dependence   . Type 2 diabetes with complication (HCC)     History reviewed. No pertinent surgical history.  Family History  Problem Relation Age of Onset  . Diabetes Mother   . Hypertension Mother   . Diabetes Father   . Hypertension Father     Social History   Socioeconomic History  . Marital status: Single    Spouse name: Not on file  . Number of children: Not on file  . Years of education: Not on file  . Highest education level: Not on file  Occupational History  . Not on file  Social Needs  . Financial resource strain: Not on file  . Food insecurity:    Worry: Not on file    Inability: Not on file  . Transportation needs:    Medical: Not on file    Non-medical: Not on file  Tobacco Use  . Smoking status: Current Every Day  Smoker    Packs/day: 0.50    Types: Cigarettes  . Smokeless tobacco: Never Used  Substance and Sexual Activity  . Alcohol use: No  . Drug use: Not Currently  . Sexual activity: Not Currently  Lifestyle  . Physical activity:    Days per week: Not on file    Minutes per session: Not on file  . Stress: Not on file  Relationships  . Social connections:    Talks on phone: Not on file    Gets together: Not on file    Attends religious service: Not on file    Active member of club or organization: Not on file    Attends meetings of clubs or organizations: Not on file    Relationship status:  Not on file  . Intimate partner violence:    Fear of current or ex partner: Not on file    Emotionally abused: Not on file    Physically abused: Not on file    Forced sexual activity: Not on file  Other Topics Concern  . Not on file  Social History Narrative  . Not on file    Outpatient Medications Prior to Visit  Medication Sig Dispense Refill  . Blood Glucose Monitoring Suppl (ACCU-CHEK AVIVA) device Use as instructed 1 each 0  . divalproex (DEPAKOTE) 500 MG DR tablet Take 500 mg by mouth 2 (two) times daily.    Marland Kitchen glucose blood (ACCU-CHEK AVIVA) test strip Use as instructed to check blood sugar 2-3 times per day 100 each 12  . Lancets (ACCU-CHEK SOFT TOUCH) lancets Use as instructed 100 each 12  . risperiDONE (RISPERDAL) 0.5 MG tablet Take 0.5 mg by mouth daily.    . risperiDONE (RISPERDAL) 2 MG tablet Take 2 mg by mouth at bedtime.    Marland Kitchen atorvastatin (LIPITOR) 10 MG tablet Take 1 tablet (10 mg total) by mouth daily. 90 tablet 3  . gabapentin (NEURONTIN) 300 MG capsule Take 1 capsule (300 mg total) by mouth 3 (three) times daily. 90 capsule 11  . glipiZIDE (GLUCOTROL) 10 MG tablet Take 1 tablet (10 mg total) by mouth 2 (two) times daily before a meal. 60 tablet 6  . lisinopril (PRINIVIL,ZESTRIL) 2.5 MG tablet Take 1 tablet (2.5 mg total) by mouth daily. 30 tablet 6  . metFORMIN (GLUCOPHAGE) 1000 MG tablet Take 1 tablet (1,000 mg total) by mouth 2 (two) times daily with a meal. 60 tablet 6  . azithromycin (ZITHROMAX) 250 MG tablet Take 1 tablet (250 mg total) by mouth daily. Take first 2 tablets together, then 1 every day until finished. 6 tablet 0   No facility-administered medications prior to visit.     No Known Allergies  ROS Review of Systems  Constitutional: Positive for fatigue. Negative for chills and fever.  HENT: Negative for sore throat and trouble swallowing.   Eyes: Negative for photophobia and visual disturbance.  Respiratory: Negative for cough and shortness of  breath.   Cardiovascular: Negative for chest pain, palpitations and leg swelling.  Gastrointestinal: Negative for abdominal pain, constipation, diarrhea and nausea.  Endocrine: Positive for polydipsia and polyuria. Negative for polyphagia.  Genitourinary: Positive for frequency (occasional). Negative for dysuria.  Musculoskeletal: Positive for arthralgias and gait problem.  Neurological: Positive for numbness. Negative for dizziness and headaches.  Hematological: Negative for adenopathy. Does not bruise/bleed easily.  Psychiatric/Behavioral: Positive for sleep disturbance. Negative for self-injury and suicidal ideas. The patient is nervous/anxious.       Objective:    Physical Exam  Constitutional: She is oriented to person, place, and time. She appears well-developed and well-nourished. No distress.  HENT:  Head: Normocephalic and atraumatic.  Right Ear: External ear normal.  Left Ear: External ear normal.  Nose: Nose normal.  Mouth/Throat: Oropharynx is clear and moist.  TMs normal bilaterally, patient with mild edema of the nasal mucosa with scant clear nasal discharge.  Patient with occasional sniffling while in the exam room.  Patient with mild posterior pharynx erythema.  Eyes: Conjunctivae and EOM are normal.  Neck: Normal range of motion. Neck supple. No JVD present. No thyromegaly present.  Cardiovascular: Normal rate, regular rhythm and normal heart sounds.  Patient with poorly palpable dorsalis pedis and posterior tibial pulses bilaterally  Pulmonary/Chest: Effort normal and breath sounds normal. No respiratory distress. She has no wheezes.  Heavy smell of tobacco/cigarette smoke on the patient and her clothing  Abdominal: Soft. She exhibits no distension. There is no abdominal tenderness. There is no rebound and no guarding.  Musculoskeletal: Normal range of motion.        General: Deformity (Hammertoe deformities on bilateral feet) present. No tenderness or edema.      Comments: Patient with some early hammertoe deformities on both feet  Lymphadenopathy:    She has no cervical adenopathy.  Neurological: She is alert and oriented to person, place, and time. No cranial nerve deficit.  Patient with abnormal diabetic foot exam with absence of sensation on the heels bilaterally.  Skin on the heels was dry and slightly thickened  Skin:  No ulcers,  erythema or skin breakdown on the feet.  Nails were within normal.  Patient with slightly thickened skin/mild calluses on the heels bilaterally  Psychiatric: She has a normal mood and affect. Her behavior is normal. Judgment and thought content normal.  Nursing note and vitals reviewed.   BP 110/69 (BP Location: Left Arm, Patient Position: Sitting, Cuff Size: Large)   Pulse 90   Temp 98.6 F (37 C) (Oral)   Resp 18   Ht 5\' 10"  (1.778 m)   Wt 201 lb (91.2 kg)   SpO2 99%   BMI 28.84 kg/m  Wt Readings from Last 3 Encounters:  11/13/18 201 lb (91.2 kg)  07/14/18 209 lb (94.8 kg)     Health Maintenance Due  Topic Date Due  . HIV Screening  11/25/1983  . TETANUS/TDAP  11/25/1987  . PAP SMEAR-Modifier  11/24/1989    There are no preventive care reminders to display for this patient.  Lab Results  Component Value Date   TSH 1.160 07/14/2018   Lab Results  Component Value Date   WBC 7.5 07/14/2018   HGB 11.5 07/14/2018   HCT 33.9 (L) 07/14/2018   MCV 96 07/14/2018   PLT 265 07/14/2018   Lab Results  Component Value Date   NA 130 (L) 07/14/2018   K 5.4 (H) 07/14/2018   CO2 24 07/14/2018   GLUCOSE 149 (H) 07/14/2018   BUN 11 07/14/2018   CREATININE 0.74 07/14/2018   BILITOT 0.2 07/14/2018   ALKPHOS 61 07/14/2018   AST 10 07/14/2018   ALT 9 07/14/2018   PROT 6.6 07/14/2018   ALBUMIN 4.3 07/14/2018   CALCIUM 9.4 07/14/2018   ANIONGAP 3 (L) 03/21/2014   No results found for: CHOL No results found for: HDL No results found for: LDLCALC No results found for: TRIG No results found for:  CHOLHDL     Assessment & Plan:  1. Diabetic polyneuropathy associated with type 2 diabetes mellitus (  HCC) Patient with poorly controlled diabetes with diabetic polyneuropathy.  Patient is provided with a refill of her gabapentin at today's visit.  Patient's hemoglobin A1c was done at today's visit and is elevated at 9.9.  Patient is provided with refills of her current medications as she has likely been out of the medicines for several weeks.  Patient's random blood sugar today's visit was elevated to 308.  Patient will also have lipid panel in follow-up of her hemoglobin A1c.  Urinalysis done by CMA per protocol due to patient's elevated blood sugar.Patient did not have any ketones on her urinalysis. - HgB A1c - Glucose (CBG) -CMET - POCT URINALYSIS DIP (CLINITEK) - gabapentin (NEURONTIN) 300 MG capsule; Take 1 capsule (300 mg total) by mouth 3 (three) times daily.  Dispense: 270 capsule; Refill: 1  2. Type 2 diabetes mellitus with hyperglycemia, without long-term current use of insulin (HCC) Due to patient's elevated blood sugar, patient was given 5 units of NovoLog at today's visit.  Patient also have repeat of her glucose.  Patient was provided with new prescriptions for her metformin and glipizide.  Patient is encouraged to start monitoring her blood sugars and call if her blood sugars are remaining greater than 160 fasting so that patient's medications can be adjusted to lower her A1c to goal of 7 or less if possible. - insulin aspart (novoLOG) injection 5 Units - Glucose (CBG) -CMET  3. Essential hypertension Patient's blood pressure is well controlled.  Refill of lisinopril 2.5 mg to help with protection of the kidneys against the effect of patient's poorly controlled diabetes. - lisinopril (PRINIVIL,ZESTRIL) 2.5 MG tablet; Take 1 tablet (2.5 mg total) by mouth daily.  Dispense: 90 tablet; Refill: 1  4. Hyperlipidemia, unspecified hyperlipidemia type Patient is currently on  atorvastatin for hyperlipidemia.  On review of her chart, I could not find a recent lipid panel.  Patient believes that she has not eaten prior to today's visit and patient will have lipid panel at today's visit.  Patient provided with refill of Lipitor 10 mg and patient will be notified if additional adjustment in medication is needed based on the results.  Patient is also encouraged to follow a low-fat diet and exercise as tolerated. - atorvastatin (LIPITOR) 10 MG tablet; Take 1 tablet (10 mg total) by mouth daily.  Dispense: 90 tablet; Refill: 1 - Lipid Panel  5. Hyponatremia Patient has had hyponatremia with sodium level of 130 on blood work done on 07/14/2018.  Patient will have BMP at today's visit in follow-up. -CMET  6. Hyperkalemia Patient will have BMP in follow-up of hyperkalemia with a potassium of 5.4 on blood work done 07/14/2018.  If patient's potassium remains elevated, patient is lisinopril will likely be stopped as this medication can contribute to hyperkalemia. -CMET  7. Encounter for long-term (current) use of medications Patient will have complete metabolic panel at today's visit in follow-up of her long-term use of medications for the treatment of diabetes, hypertension and hyperlipidemia. -CMET  8. Tobacco use .  The need for complete smoking cessation was discussed with the patient at today's visit and patient was made aware that resources are available through this clinic including scheduling a follow-up appointment with the clinical pharmacist here to discuss smoking cessation and medications are available to help with smoking cessation as well.  9. Abnormal urinalysis Patient with abnormal urinalysis with presence of hematuria and patient's urine will be sent for culture and patient will be notified if any further treatment is needed  based on her urine culture results - Urine Culture  An After Visit Summary was printed and given to the patient.  Follow-up: Return in  about 6 weeks (around 12/25/2018) for DM.    Cain Saupeammie Lari Linson, MD

## 2018-11-18 ENCOUNTER — Encounter: Payer: Self-pay | Admitting: Family Medicine

## 2018-12-21 LAB — HM DIABETES EYE EXAM

## 2018-12-25 ENCOUNTER — Ambulatory Visit: Payer: Medicaid Other | Attending: Family Medicine | Admitting: Family Medicine

## 2018-12-25 ENCOUNTER — Encounter: Payer: Self-pay | Admitting: Family Medicine

## 2018-12-25 VITALS — BP 107/71 | HR 77 | Temp 98.2°F | Resp 18 | Ht 70.0 in | Wt 201.0 lb

## 2018-12-25 DIAGNOSIS — Z7984 Long term (current) use of oral hypoglycemic drugs: Secondary | ICD-10-CM | POA: Insufficient documentation

## 2018-12-25 DIAGNOSIS — E871 Hypo-osmolality and hyponatremia: Secondary | ICD-10-CM

## 2018-12-25 DIAGNOSIS — E1165 Type 2 diabetes mellitus with hyperglycemia: Secondary | ICD-10-CM

## 2018-12-25 DIAGNOSIS — Z9841 Cataract extraction status, right eye: Secondary | ICD-10-CM | POA: Diagnosis not present

## 2018-12-25 DIAGNOSIS — F17210 Nicotine dependence, cigarettes, uncomplicated: Secondary | ICD-10-CM | POA: Diagnosis not present

## 2018-12-25 DIAGNOSIS — Z8249 Family history of ischemic heart disease and other diseases of the circulatory system: Secondary | ICD-10-CM | POA: Diagnosis not present

## 2018-12-25 DIAGNOSIS — E1142 Type 2 diabetes mellitus with diabetic polyneuropathy: Secondary | ICD-10-CM | POA: Diagnosis present

## 2018-12-25 DIAGNOSIS — E875 Hyperkalemia: Secondary | ICD-10-CM | POA: Diagnosis not present

## 2018-12-25 DIAGNOSIS — Z9842 Cataract extraction status, left eye: Secondary | ICD-10-CM | POA: Diagnosis not present

## 2018-12-25 DIAGNOSIS — Z833 Family history of diabetes mellitus: Secondary | ICD-10-CM | POA: Diagnosis not present

## 2018-12-25 DIAGNOSIS — I10 Essential (primary) hypertension: Secondary | ICD-10-CM

## 2018-12-25 DIAGNOSIS — Z79899 Other long term (current) drug therapy: Secondary | ICD-10-CM

## 2018-12-25 DIAGNOSIS — E785 Hyperlipidemia, unspecified: Secondary | ICD-10-CM

## 2018-12-25 LAB — POCT URINALYSIS DIP (CLINITEK)
Bilirubin, UA: NEGATIVE
Blood, UA: NEGATIVE
Glucose, UA: >=1000 mg/dL — AB
Ketones, POC UA: NEGATIVE mg/dL
Leukocytes, UA: NEGATIVE
Nitrite, UA: NEGATIVE
POC PROTEIN,UA: NEGATIVE
Spec Grav, UA: <=1.005 — AB
Urobilinogen, UA: 0.2 U/dL
pH, UA: 6

## 2018-12-25 LAB — GLUCOSE, POCT (MANUAL RESULT ENTRY): POC Glucose: 274 mg/dL — AB (ref 70–99)

## 2018-12-25 MED ORDER — GABAPENTIN 300 MG PO CAPS: 600.0000 mg | ORAL_CAPSULE | Freq: Three times a day (TID) | ORAL | 5 refills | Status: DC

## 2018-12-25 MED FILL — GABAPENTIN 300 MG CAPSULE: 300 | 30 days supply | Qty: 180 | Fill #0

## 2018-12-25 NOTE — Progress Notes (Signed)
Established Patient Office Visit  Subjective:  Patient ID: Ashley Mcgee, female    DOB: 1971-03-15  Age: 48 y.o. MRN: [id number]  CC: Follow-up of diabetes with neuropathy  HPI Eyva Heron presents for follow-up of diabetes with polyneuropathy for which she was seen on 11/13/2018. At her visit, patient had been out of her DM medications for a few weeks. Hgb A1c at that visit was elevated at 9.9. Random blood sugar was 308. Patient was restarted on her medications for her diabetes and gabapentin added for her peripheral neuropathy. Patient as also restarted on lisinopril for renal protection and Rx for lipitor for treatment of hyperlipidemia. She also had hematuria on UA.  Patient also encouraged to stop smoking and follow-up with her mental health provider for ongoing treatment for anxiety. Patient did not have labs ordered at her last visit and will obtain labs and repeat UA today.      He reports that she is taking her diabetes medication.  Patient however states that her blood sugars are running around 200 fasting.  She states that she only eats about 1 meal per day.  Patient is actually fasting today because she has not eaten since 4:00 pm yesterday.  On review of medicines, patient has been prescribed glipizide 10 mg twice daily but states that she is only taking it once daily as she has only been eating once per day.  Patient states that this is more of a habit and she believes that she can increase to 2 or more meals per day.  Patient does have some increased thirst related to her blood sugars but denies any urinary frequency and no blurred vision.  Patient reports that she had her diabetic eye exam done last week at [facility].  Patient reports history of cataract surgery bilaterally.      Patient denies any difficulty with lisinopril for renal protection.  No cough.  No headaches or dizziness related to blood pressure.  Patient denies any increased muscle pain related to her use of  atorvastatin for her cholesterol.  Patient continues to have chronic burning/sharp pain in both feet secondary to her diabetes.  Patient has been taking 300 mg twice daily but reports that she would occasionally take 600 mg for some doses which has caused her to run out of medication early.  Patient reports that without the medication her foot pain is a 10 on a 0-to-10 scale with 10 being the worst imaginable pain.  With the use of Neurontin her pain is decreased between a 4 and a 6 and she would like to have an increase in her dose to 2 pills 3 times daily.  Past Medical History:  Diagnosis Date  . Alcohol dependence in remission (HCC)   . Diabetic peripheral neuropathy (HCC)   . Hyperlipidemia   . Tobacco dependence   . Type 2 diabetes with complication (HCC)     No past surgical history on file.  Family History  Problem Relation Age of Onset  . Diabetes Mother   . Hypertension Mother   . Diabetes Father   . Hypertension Father     Social History   Socioeconomic History  . Marital status: Single    Spouse name: Not on file  . Number of children: Not on file  . Years of education: Not on file  . Highest education level: Not on file  Occupational History  . Not on file  Social Needs  . Financial resource strain: Not on file  .  Food insecurity:    Worry: Not on file    Inability: Not on file  . Transportation needs:    Medical: Not on file    Non-medical: Not on file  Tobacco Use  . Smoking status: Current Every Day Smoker    Packs/day: 0.50    Types: Cigarettes  . Smokeless tobacco: Never Used  Substance and Sexual Activity  . Alcohol use: No  . Drug use: Not Currently  . Sexual activity: Not Currently  Lifestyle  . Physical activity:    Days per week: Not on file    Minutes per session: Not on file  . Stress: Not on file  Relationships  . Social connections:    Talks on phone: Not on file    Gets together: Not on file    Attends religious service: Not on file     Active member of club or organization: Not on file    Attends meetings of clubs or organizations: Not on file    Relationship status: Not on file  . Intimate partner violence:    Fear of current or ex partner: Not on file    Emotionally abused: Not on file    Physically abused: Not on file    Forced sexual activity: Not on file  Other Topics Concern  . Not on file  Social History Narrative  . Not on file    Outpatient Medications Prior to Visit  Medication Sig Dispense Refill  . atorvastatin (LIPITOR) 10 MG tablet Take 1 tablet (10 mg total) by mouth daily. 90 tablet 1  . Blood Glucose Monitoring Suppl (ACCU-CHEK AVIVA) device Use as instructed 1 each 0  . divalproex (DEPAKOTE) 500 MG DR tablet Take 500 mg by mouth 2 (two) times daily.    Marland Kitchen gabapentin (NEURONTIN) 300 MG capsule Take 1 capsule (300 mg total) by mouth 3 (three) times daily. 270 capsule 1  . glipiZIDE (GLUCOTROL) 10 MG tablet Take 1 tablet (10 mg total) by mouth 2 (two) times daily before a meal. 180 tablet 1  . glucose blood (ACCU-CHEK AVIVA) test strip Use as instructed to check blood sugar 2-3 times per day 100 each 12  . Lancets (ACCU-CHEK SOFT TOUCH) lancets Use as instructed 100 each 12  . lisinopril (PRINIVIL,ZESTRIL) 2.5 MG tablet Take 1 tablet (2.5 mg total) by mouth daily. 90 tablet 1  . metFORMIN (GLUCOPHAGE) 1000 MG tablet Take 1 tablet (1,000 mg total) by mouth 2 (two) times daily with a meal. 180 tablet 1  . risperiDONE (RISPERDAL) 0.5 MG tablet Take 0.5 mg by mouth daily.    . risperiDONE (RISPERDAL) 2 MG tablet Take 2 mg by mouth at bedtime.     No facility-administered medications prior to visit.     No Known Allergies  ROS Review of Systems  Constitutional: Positive for fatigue (improving). Negative for chills and fever.  HENT: Negative for sore throat and trouble swallowing.   Eyes: Negative for photophobia and visual disturbance.  Respiratory: Negative for cough and shortness of breath.     Cardiovascular: Negative for chest pain, palpitations and leg swelling.  Gastrointestinal: Negative for abdominal pain, blood in stool, constipation, diarrhea and nausea.  Endocrine: Positive for polydipsia. Negative for polyphagia and polyuria.  Genitourinary: Negative for dysuria and frequency.  Musculoskeletal: Positive for gait problem (foot pain due to diabetic neuropathy). Negative for arthralgias and back pain.  Neurological: Negative for dizziness and headaches.  Hematological: Negative for adenopathy. Does not bruise/bleed easily.  Psychiatric/Behavioral: Negative for  self-injury and suicidal ideas. The patient is nervous/anxious (on medication).       Objective:    Physical Exam BP 107/71 (BP Location: Left Arm, Patient Position: Sitting, Cuff Size: Normal)   Pulse 77   Temp 98.2 F (36.8 C) (Oral)   Resp 18   Ht 5\' 10"  (1.778 m)   Wt 201 lb (91.2 kg)   SpO2 99%   BMI 28.84 kg/m  Nurse's notes and vital signs reviewed  General-well-nourished, well-developed overweight for height female in no acute distress Neck-supple, no lymphadenopathy, no thyromegaly, no carotid bruit Lungs-clear to auscultation bilaterally, breathing is nonlabored Cardiovascular-regular rate and regular rhythm Abdomen-soft, nontender Back-no CVA tenderness Extremities-no edema Psych-normal mood and judgment  Wt Readings from Last 3 Encounters:  11/13/18 201 lb (91.2 kg)  07/14/18 209 lb (94.8 kg)     Health Maintenance Due  Topic Date Due  . HIV Screening  11/25/1983  . TETANUS/TDAP  11/25/1987  . PAP SMEAR-Modifier  11/24/1989  . MAMMOGRAM  11/24/2018  . COLONOSCOPY  11/24/2018    There are no preventive care reminders to display for this patient.  Lab Results  Component Value Date   TSH 1.160 07/14/2018   Lab Results  Component Value Date   WBC 7.5 07/14/2018   HGB 11.5 07/14/2018   HCT 33.9 (L) 07/14/2018   MCV 96 07/14/2018   PLT 265 07/14/2018   Lab Results   Component Value Date   NA 130 (L) 07/14/2018   K 5.4 (H) 07/14/2018   CO2 24 07/14/2018   GLUCOSE 149 (H) 07/14/2018   BUN 11 07/14/2018   CREATININE 0.74 07/14/2018   BILITOT 0.2 07/14/2018   ALKPHOS 61 07/14/2018   AST 10 07/14/2018   ALT 9 07/14/2018   PROT 6.6 07/14/2018   ALBUMIN 4.3 07/14/2018   CALCIUM 9.4 07/14/2018   ANIONGAP 3 (L) 03/21/2014   No results found for: CHOL Lipid panel will be done at today's visit No results found for: HDL No results found for: LDLCALC No results found for: TRIG No results found for: [facility] Lab Results  Component Value Date   HGBA1C 9.9 (A) 11/13/2018      Assessment & Plan:  1. Poorly controlled type 2 diabetes mellitus with peripheral neuropathy (HCC) Patient's most recent hemoglobin A1c was elevated at 9.9.  Patient reports that her fasting blood sugars are still in the 200s.  Patient states that she has only been eating once per day.  Patient was encouraged to eat more than once a day by increasing to twice daily and eventually going to mail at least every 6-8 hours.  Patient has only been taking the prescribed glipizide once daily and patient will start taking the medication twice daily with a meal in addition to metformin.  I believe that this should help bring her blood sugars closer to goal of 140 or less fasting.  Repeat urinalysis done at today's visit as patient had hematuria on last urinalysis.  Patient denies any urinary symptoms at today's visit.  Patient's dose of gabapentin will be increased to 600 mg 3 times daily to help with peripheral neuropathic symptoms.  Diabetic foot care again discussed.  Diabetic foot exam was done at her last visit.  Patient reports that she had diabetic eye exam done last week. - POCT URINALYSIS DIP (CLINITEK) - Glucose (CBG) - gabapentin (NEURONTIN) 300 MG capsule; Take 2 capsules (600 mg total) by mouth 3 (three) times daily.  Dispense: 270 capsule; Refill: 5  2.  Essential hypertension Blood  pressures controlled with the use of lisinopril which is also given for renal protection.  Patient will have CMP as she has had issues with potassium and sodium in the past. - Comprehensive metabolic panel  3. Hyperlipidemia, unspecified hyperlipidemia type Patient is currently on atorvastatin 10 mg daily and patient is encouraged to continue a low-fat diet.  Patient will have lipid panel at today's visit as well as CMP in follow-up of use of medications for treatment of diabetes, hypertension and hyperlipidemia. - Lipid panel  4. Hyponatremia Discussed with the patient that she has had past issues with hyponatremia and that low sodium can cause dizziness/loss of balance as well as more serious issues.  Patient will have sodium level checked as part of her CMP  5. Hyperkalemia Patient has had prior mild increase in potassium level and patient is on lisinopril which can also increase potassium.  Patient will have potassium level rechecked as part of CMP and will be notified if any changes are needed.  6.  Long-term use of medications Patient is on several medications some of which are considered high risk such as her atorvastatin which can increase LFTs and patient also with use of atypical antipsychotics per mental health.  Patient will have CMP in follow-up with medications  An After Visit Summary was printed and given to the patient. Allergies as of 12/25/2018   No Known Allergies     Medication List       Accurate as of December 25, 2018  2:36 PM. Always use your most recent med list.        ACCU-CHEK AVIVA device Use as instructed   accu-chek soft touch lancets Use as instructed   atorvastatin 10 MG tablet Commonly known as:  LIPITOR Take 1 tablet (10 mg total) by mouth daily.   divalproex 500 MG DR tablet Commonly known as:  DEPAKOTE Take 500 mg by mouth 2 (two) times daily.   gabapentin 300 MG capsule Commonly known as:  NEURONTIN Take 2 capsules (600 mg total) by mouth 3  (three) times daily.   glipiZIDE 10 MG tablet Commonly known as:  GLUCOTROL Take 1 tablet (10 mg total) by mouth 2 (two) times daily before a meal.   glucose blood test strip Commonly known as:  ACCU-CHEK AVIVA Use as instructed to check blood sugar 2-3 times per day   lisinopril 2.5 MG tablet Commonly known as:  PRINIVIL,ZESTRIL Take 1 tablet (2.5 mg total) by mouth daily.   metFORMIN 1000 MG tablet Commonly known as:  GLUCOPHAGE Take 1 tablet (1,000 mg total) by mouth 2 (two) times daily with a meal.   risperiDONE 0.5 MG tablet Commonly known as:  RISPERDAL Take 0.5 mg by mouth daily.   risperiDONE 2 MG tablet Commonly known as:  RISPERDAL Take 2 mg by mouth at bedtime.       Follow-up: Return in about 3 months (around 03/27/2019) for DM.    Cain Saupeammie Julieanna Geraci, MD

## 2018-12-26 LAB — LIPID PANEL
Chol/HDL Ratio: 6.7 ratio — ABNORMAL HIGH (ref 0.0–4.4)
Cholesterol, Total: 302 mg/dL — ABNORMAL HIGH (ref 100–199)
HDL: 45 mg/dL
LDL Calculated: 184 mg/dL — ABNORMAL HIGH (ref 0–99)
Triglycerides: 364 mg/dL — ABNORMAL HIGH (ref 0–149)
VLDL Cholesterol Cal: 73 mg/dL — ABNORMAL HIGH (ref 5–40)

## 2018-12-26 LAB — COMPREHENSIVE METABOLIC PANEL WITH GFR
ALT: 18 IU/L (ref 0–32)
AST: 14 IU/L (ref 0–40)
Albumin/Globulin Ratio: 1.8 (ref 1.2–2.2)
Albumin: 4.4 g/dL (ref 3.8–4.8)
Alkaline Phosphatase: 85 IU/L (ref 39–117)
BUN/Creatinine Ratio: 17 (ref 9–23)
BUN: 12 mg/dL (ref 6–24)
Bilirubin Total: 0.4 mg/dL (ref 0.0–1.2)
CO2: 24 mmol/L (ref 20–29)
Calcium: 9.2 mg/dL (ref 8.7–10.2)
Chloride: 96 mmol/L (ref 96–106)
Creatinine, Ser: 0.7 mg/dL (ref 0.57–1.00)
GFR calc Af Amer: 117 mL/min/1.73
GFR calc non Af Amer: 101 mL/min/1.73
Globulin, Total: 2.5 g/dL (ref 1.5–4.5)
Glucose: 281 mg/dL — ABNORMAL HIGH (ref 65–99)
Potassium: 4.2 mmol/L (ref 3.5–5.2)
Sodium: 137 mmol/L (ref 134–144)
Total Protein: 6.9 g/dL (ref 6.0–8.5)

## 2019-01-02 ENCOUNTER — Other Ambulatory Visit: Payer: Self-pay | Admitting: Family Medicine

## 2019-01-02 DIAGNOSIS — E785 Hyperlipidemia, unspecified: Secondary | ICD-10-CM

## 2019-01-02 MED ORDER — ATORVASTATIN CALCIUM 40 MG PO TABS: 40.0000 mg | ORAL_TABLET | Freq: Every day | ORAL | 5 refills | Status: DC

## 2019-01-02 NOTE — Progress Notes (Signed)
Patient ID: Ashley Mcgee, female   DOB: 10-05-1971, 48 y.o.   MRN: [id number]   Patient with recent labs showing LDL of 184 and atorvastatin dose will be increased to 40 mg daily. Patient will be notified and new RX will be sent to pharmacy

## 2019-01-03 ENCOUNTER — Telehealth: Payer: Self-pay | Admitting: *Deleted

## 2019-01-03 NOTE — Telephone Encounter (Signed)
-----   Message from Cain Saupe, MD sent at 01/02/2019  9:25 PM EDT ----- On complete metabolic panel, glucose is elevated at 281 but otherwise normal. Cholesterol is high with a total cholesterol of 302, TG of 364 (normal 150 or less), LDL is 184 with goal being 70 or less. Please let patient know that a higher dose of her atorvastatin will be sent to her pharmacy (she is currently on 10 mg and this will be increased to 40 mg). She should also follow a low carb and a low fat diet

## 2019-01-03 NOTE — Telephone Encounter (Signed)
Patient verified DOB Patient is aware of CMP being normal and needing to adhere to DM medications for elevated glucose. Patient is also aware of cholesterol being elevated and needing to start 40 mg of atorvastatin daily instead of the current 10. No further questions at this time.

## 2019-01-30 MED FILL — GABAPENTIN 300 MG CAPSULE: 300 | 30 days supply | Qty: 180 | Fill #1

## 2019-02-26 MED FILL — GABAPENTIN 300 MG CAPSULE: 300 | 90 days supply | Qty: 270 | Fill #1

## 2019-03-22 MED FILL — GABAPENTIN 300 MG CAPSULE: 300 | 30 days supply | Qty: 180 | Fill #2

## 2019-03-28 ENCOUNTER — Ambulatory Visit: Payer: Medicaid Other | Admitting: Family Medicine

## 2019-04-02 ENCOUNTER — Ambulatory Visit: Payer: Medicaid Other | Admitting: Family Medicine

## 2019-04-11 ENCOUNTER — Ambulatory Visit: Payer: Medicaid Other | Attending: Family Medicine | Admitting: Family Medicine

## 2019-04-11 ENCOUNTER — Other Ambulatory Visit: Payer: Self-pay

## 2019-04-11 ENCOUNTER — Encounter: Payer: Self-pay | Admitting: Family Medicine

## 2019-04-11 ENCOUNTER — Other Ambulatory Visit: Payer: Self-pay | Admitting: Pharmacist

## 2019-04-11 VITALS — BP 122/76 | HR 70 | Temp 98.9°F | Ht 70.0 in | Wt 195.0 lb

## 2019-04-11 DIAGNOSIS — I10 Essential (primary) hypertension: Secondary | ICD-10-CM | POA: Diagnosis not present

## 2019-04-11 DIAGNOSIS — E1165 Type 2 diabetes mellitus with hyperglycemia: Secondary | ICD-10-CM

## 2019-04-11 DIAGNOSIS — E1143 Type 2 diabetes mellitus with diabetic autonomic (poly)neuropathy: Secondary | ICD-10-CM | POA: Diagnosis not present

## 2019-04-11 DIAGNOSIS — G5793 Unspecified mononeuropathy of bilateral lower limbs: Secondary | ICD-10-CM

## 2019-04-11 DIAGNOSIS — Z720 Tobacco use: Secondary | ICD-10-CM

## 2019-04-11 DIAGNOSIS — Z79899 Other long term (current) drug therapy: Secondary | ICD-10-CM

## 2019-04-11 DIAGNOSIS — E1142 Type 2 diabetes mellitus with diabetic polyneuropathy: Secondary | ICD-10-CM

## 2019-04-11 DIAGNOSIS — T07XXXA Unspecified multiple injuries, initial encounter: Secondary | ICD-10-CM

## 2019-04-11 DIAGNOSIS — K029 Dental caries, unspecified: Secondary | ICD-10-CM

## 2019-04-11 LAB — POCT GLYCOSYLATED HEMOGLOBIN (HGB A1C): Hemoglobin A1C: 11.3 % — AB (ref 4.0–5.6)

## 2019-04-11 MED ORDER — LANTUS SOLOSTAR 100 UNIT/ML ~~LOC~~ SOPN: 10.0000 [IU] | PEN_INJECTOR | Freq: Every day | SUBCUTANEOUS | 99 refills | Status: DC

## 2019-04-11 MED ORDER — SILVER SULFADIAZINE 1 % EX CREA: 1.0000 "application " | TOPICAL_CREAM | Freq: Every day | CUTANEOUS | 2 refills | Status: DC

## 2019-04-11 MED ORDER — GABAPENTIN 300 MG PO CAPS: 600.0000 mg | ORAL_CAPSULE | Freq: Three times a day (TID) | ORAL | 5 refills | Status: DC

## 2019-04-11 MED ORDER — TRUEPLUS PEN NEEDLES 32G X 4 MM MISC: 11 refills | Status: DC

## 2019-04-11 MED FILL — LANTUS SOLOSTAR 100 UNITS/M: 100 | 90 days supply | Qty: 15 | Fill #0

## 2019-04-11 MED FILL — TRUEPLUS PEN NDL 32GX5/32": 32G X 4 MM | 90 days supply | Qty: 100 | Fill #0

## 2019-04-11 MED FILL — TRUEPLUS PEN NDL 32GX5/32: 32G X 4 MM | 90 days supply | Qty: 100 | Fill #0

## 2019-04-11 NOTE — Progress Notes (Signed)
Established Patient Office Visit  Subjective:  Patient ID: Ashley Mcgee, female    DOB: 08/25/1971  Age: 48 y.o. MRN: [id number]  CC:  Chief Complaint  Patient presents with  . 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  Patient states that last week, she had gotten out of her car and stood up when she suddenly fell to the ground scraping her knees lower legs and on top of her feet on the pavement.  She states that she did not feel faint prior to attempting to stand up and get out of her car.  She states that she has now had several falls over the past weeks to month.  She feels that her falls are related to changes in position such as getting up from her bed or getting up from a chair/seated position.  She does not have any warning before she falls.  She did suffer some areas of scraped skin when she fell in the parking lot and has some pain over her kneecaps where she fell onto her knees and has some discomfort on the palms of her hands where she scraped them during her fall.  She denies any serious fall related injuries at this time.  She is concerned however that additional falls may occur.       Patient states that her blood sugars were improved however with the COVID-19 pandemic and being at home, she believes that she has been eating more of the wrong types of foods and that her blood sugars are likely elevated as she has had some increased thirst and occasional increased urinary frequency.  She denies any headaches or dizziness related to her blood pressure.  She continues to have painful burning and stinging in both feet related to her diabetes.  She denies any chest pain or palpitations.  No shortness of breath or cough.  She does continue to smoke and again she does not feel that she can quit at this time as smoking seems to help with her  anxiety.  She does continue to see a mental health provider regarding her anxiety/depression and bipolar disorder.  She is taking her cholesterol medication and denies any increase in muscle or joint pain related to her cholesterol medication.  She does continue to have issues with fatigue.  Past Medical History:  Diagnosis Date  . Alcohol dependence in remission ([place])   . Diabetic peripheral neuropathy ([place])   . Hyperlipidemia   . Tobacco dependence   . Type 2 diabetes with complication [facility])     Past Surgical History:  Procedure Laterality Date  . cataract surgery      Family History  Problem Relation Age of Onset  . Diabetes Mother   . Hypertension Mother   . Diabetes Father   . Hypertension Father     Social History   Tobacco Use  . Smoking status: Current Every Day Smoker    Packs/day: 0.50    Types: Cigarettes  . Smokeless tobacco: Never Used  Substance Use Topics  . Alcohol use: No  . Drug use: Not Currently    Outpatient Medications Prior to Visit  Medication Sig Dispense Refill  . atorvastatin (LIPITOR) 40 MG tablet Take 1 tablet (40 mg total) by mouth daily. To lower cholesterol 30 tablet 5  . Blood Glucose Monitoring Suppl (ACCU-CHEK AVIVA) device Use as instructed 1 each 0  .  divalproex (DEPAKOTE) 500 MG DR tablet Take 500 mg by mouth 2 (two) times daily.    Marland Kitchen. gabapentin (NEURONTIN) 300 MG capsule Take 2 capsules (600 mg total) by mouth 3 (three) times daily. 270 capsule 5  . glipiZIDE (GLUCOTROL) 10 MG tablet Take 1 tablet (10 mg total) by mouth 2 (two) times daily before a meal. 180 tablet 1  . glucose blood (ACCU-CHEK AVIVA) test strip Use as instructed to check blood sugar 2-3 times per day 100 each 12  . Lancets (ACCU-CHEK SOFT TOUCH) lancets Use as instructed 100 each 12  . lisinopril (PRINIVIL,ZESTRIL) 2.5 MG tablet Take 1 tablet (2.5 mg total) by mouth daily. 90 tablet 1  . metFORMIN (GLUCOPHAGE) 1000 MG tablet Take 1 tablet (1,000 mg total) by mouth  2 (two) times daily with a meal. 180 tablet 1  . risperiDONE (RISPERDAL) 0.5 MG tablet Take 0.5 mg by mouth daily.    . risperiDONE (RISPERDAL) 2 MG tablet Take 2 mg by mouth at bedtime.     No facility-administered medications prior to visit.     No Known Allergies  ROS Review of Systems  Constitutional: Positive for fatigue. Negative for chills and fever.  Respiratory: Negative for cough and shortness of breath.   Cardiovascular: Negative for chest pain, palpitations and leg swelling.  Gastrointestinal: Positive for nausea (occasional). Negative for abdominal pain, constipation and diarrhea.  Endocrine: Positive for polydipsia and polyuria. Negative for polyphagia.  Genitourinary: Positive for frequency (at times). Negative for dysuria.  Musculoskeletal: Positive for arthralgias and back pain.  Skin: Positive for wound (scratches/wounds on knees/legs/feet). Negative for rash.  Neurological: Negative for dizziness and headaches.  Hematological: Negative for adenopathy. Does not bruise/bleed easily.      Objective:    Physical Exam  Constitutional: She is oriented to person, place, and time. She appears well-developed.  Neck: Normal range of motion. Neck supple.  Cardiovascular: Normal rate and regular rhythm.  Reduced peripheral pulses  Pulmonary/Chest: Effort normal and breath sounds normal.  Abdominal: Soft.  Musculoskeletal:        General: No edema.  Lymphadenopathy:    She has no cervical adenopathy.  Neurological: She is alert and oriented to person, place, and time.  Skin: Skin is warm and dry.  Patient with scabbed abrasions on the tops of the feet, ankles and hands (knees not visualized)  Psychiatric: She has a normal mood and affect. Her behavior is normal.  Nursing note and vitals reviewed.   BP 122/76 (BP Location: Right Arm, Patient Position: Sitting, Cuff Size: Large)   Pulse 70   Temp 98.9 F (37.2 C) (Oral)   Ht 5\' 10"  (1.778 m)   Wt 195 lb (88.5 kg)    SpO2 99%   BMI 27.98 kg/m  Wt Readings from Last 3 Encounters:  04/11/19 195 lb (88.5 kg)  12/25/18 201 lb (91.2 kg)  11/13/18 201 lb (91.2 kg)     Health Maintenance Due  Topic Date Due  . HIV Screening  11/25/1983  . TETANUS/TDAP  11/25/1987  . PAP SMEAR-Modifier  11/24/1989  . MAMMOGRAM  11/24/2018  . COLONOSCOPY  11/24/2018      Lab Results  Component Value Date   TSH 1.160 07/14/2018   Lab Results  Component Value Date   WBC 7.5 07/14/2018   HGB 11.5 07/14/2018   HCT 33.9 (L) 07/14/2018   MCV 96 07/14/2018   PLT 265 07/14/2018   Lab Results  Component Value Date   NA 137 12/25/2018  K 4.2 12/25/2018   CO2 24 12/25/2018   GLUCOSE 281 (H) 12/25/2018   BUN 12 12/25/2018   CREATININE 0.70 12/25/2018   BILITOT 0.4 12/25/2018   ALKPHOS 85 12/25/2018   AST 14 12/25/2018   ALT 18 12/25/2018   PROT 6.9 12/25/2018   ALBUMIN 4.4 12/25/2018   CALCIUM 9.2 12/25/2018   ANIONGAP 3 (L) 03/21/2014   Lab Results  Component Value Date   CHOL 302 (H) 12/25/2018   Lab Results  Component Value Date   HDL 45 12/25/2018   Lab Results  Component Value Date   LDLCALC 184 (H) 12/25/2018   Lab Results  Component Value Date   TRIG 364 (H) 12/25/2018   Lab Results  Component Value Date   CHOLHDL 6.7 (H) 12/25/2018   Lab Results  Component Value Date   HGBA1C 9.9 (A) 11/13/2018      Assessment & Plan:  1. Poorly controlled type 2 diabetes mellitus with peripheral neuropathy [facility](HCC) Patient with hemoglobin A1c in January was 9.9.  Patient will have repeat hemoglobin A1c, lipid panel and CMP at today's visit.  She is encouraged to monitor her blood sugars, be adherent with her diet.  She is agreeable to start the use of Lantus initially 10 units daily and will continue her current medications.  Referral to dentistry for oral care.  Refill provided for gabapentin for treatment of neuropathy.  I also discussed with the patient that I believe that her falls may  represent autonomic dysfunction due to neuropathy.  Patient is to return to clinic in approximately 4 weeks and bring her blood sugar diary and insulin dose will likely need to be titrated.  If patient's hemoglobin A1c is higher than at her last visit, patient will likely be referred to endocrinology for ongoing treatment of her diabetes. - HgB A1c - Lipid panel - Comprehensive metabolic panel - Insulin Glargine (LANTUS SOLOSTAR) 100 UNIT/ML Solostar Pen; Inject 10 Units into the skin daily.  Dispense: 5 pen; Refill: PRN - gabapentin (NEURONTIN) 300 MG capsule; Take 2 capsules (600 mg total) by mouth 3 (three) times daily. For nerve pain  Dispense: 270 capsule; Refill: 5 - Ambulatory referral to Dentistry  2. Essential hypertension Patient's blood pressure is at 122/76 at today's visit with goal blood pressure being 130/80 or less.  Continue with low-dose lisinopril and patient will also have lipid panel at today's visit. - Lipid panel  3. Neuropathic pain of both feet Gabapentin refilled to help with neuropathic foot pain secondary to diabetes - Comprehensive metabolic panel  4. Autonomic dysfunction with type 2 diabetes mellitus (HCC) I discussed with the patient that her falls may be related to autonomic dysfunction secondary to her type 2 diabetes.  Patient is encouraged to avoid sudden changes in position which can lead to falls/syncope if she does not allow her body to adjust to her changes in position.  She is encouraged to stand up very slowly and hold onto something when she gets up from a seated position and also make sure that she is not dizzy or lightheaded after changing positions before returning to walk.  Patient might also benefit from compression hose.  Patient was asked to consider neurology and/or cardiology referral for further evaluation if she has continued issues with falls.  5. Abrasions of multiple sites Signs and symptoms of infection were discussed with the patient and  prescription provided for Silvadene cream to apply once daily to areas of abraded skin to help with healing. -  silver sulfADIAZINE (SILVADENE) 1 % cream; Apply 1 application topically daily. To affected skin area x 10 days then as needed until healed  Dispense: 50 g; Refill: 2  6. Encounter for long-term (current) use of medications Patient will have CMP done in follow-up of her long term use of medications such as atorvastatin, gabapentin and her mental health medications. - Comprehensive metabolic panel  7. Tobacco use Smoking cessation again advised but patient is not sure that she is ready yet to stop smoking as she has some increased anxiety due to current COVID-19 concerns.  8. Dental caries, unspecified Patient with complaint of dental caries which is likely made worse by patient's uncontrolled diabetes.  Oral care discussed and patient will be referred to dentistry for further evaluation and treatment - Ambulatory referral to Dentistry   An After Visit Summary was printed and given to the patient.  Follow-up: Return in about 4 weeks (around 05/09/2019) for DM- can be tele-visit unless labs abnormal.   Cain Saupeammie Tazia Illescas, MD

## 2019-04-11 NOTE — Progress Notes (Signed)
DM follow up  CBG today was 278  Per pt she need to talk about possible Kasandra Knudsen due to falls. Per pt she falls about 1-2 times per week

## 2019-04-12 ENCOUNTER — Other Ambulatory Visit: Payer: Self-pay | Admitting: Family Medicine

## 2019-04-12 DIAGNOSIS — E782 Mixed hyperlipidemia: Secondary | ICD-10-CM

## 2019-04-12 DIAGNOSIS — E1169 Type 2 diabetes mellitus with other specified complication: Secondary | ICD-10-CM

## 2019-04-12 LAB — LIPID PANEL
Chol/HDL Ratio: 7 ratio — ABNORMAL HIGH (ref 0.0–4.4)
Cholesterol, Total: 253 mg/dL — ABNORMAL HIGH (ref 100–199)
HDL: 36 mg/dL — ABNORMAL LOW
LDL Calculated: 158 mg/dL — ABNORMAL HIGH (ref 0–99)
Triglycerides: 294 mg/dL — ABNORMAL HIGH (ref 0–149)
VLDL Cholesterol Cal: 59 mg/dL — ABNORMAL HIGH (ref 5–40)

## 2019-04-12 LAB — COMPREHENSIVE METABOLIC PANEL WITH GFR
ALT: 23 IU/L (ref 0–32)
AST: 23 IU/L (ref 0–40)
Albumin/Globulin Ratio: 1.8 (ref 1.2–2.2)
Albumin: 4.5 g/dL (ref 3.8–4.8)
Alkaline Phosphatase: 79 IU/L (ref 39–117)
BUN/Creatinine Ratio: 12 (ref 9–23)
BUN: 8 mg/dL (ref 6–24)
Bilirubin Total: 0.3 mg/dL (ref 0.0–1.2)
CO2: 23 mmol/L (ref 20–29)
Calcium: 10.1 mg/dL (ref 8.7–10.2)
Chloride: 95 mmol/L — ABNORMAL LOW (ref 96–106)
Creatinine, Ser: 0.69 mg/dL (ref 0.57–1.00)
GFR calc Af Amer: 117 mL/min/1.73
GFR calc non Af Amer: 102 mL/min/1.73
Globulin, Total: 2.5 g/dL (ref 1.5–4.5)
Glucose: 249 mg/dL — ABNORMAL HIGH (ref 65–99)
Potassium: 4.2 mmol/L (ref 3.5–5.2)
Sodium: 135 mmol/L (ref 134–144)
Total Protein: 7 g/dL (ref 6.0–8.5)

## 2019-04-12 MED ORDER — ROSUVASTATIN CALCIUM 20 MG PO TABS: 20.0000 mg | ORAL_TABLET | Freq: Every day | ORAL | 6 refills | Status: DC

## 2019-04-12 NOTE — Progress Notes (Signed)
Patient ID: Ashley Mcgee, female   DOB: May 30, 1971, 48 y.o.   MRN: [id number]   On repeat lipid panel patient has had improvement in her TG and LDL but still very high. Will have her stop atorvastatin 40 mg daily and start crestor 20 mg.

## 2019-04-18 ENCOUNTER — Other Ambulatory Visit: Payer: Self-pay | Admitting: Family Medicine

## 2019-04-18 DIAGNOSIS — E1142 Type 2 diabetes mellitus with diabetic polyneuropathy: Secondary | ICD-10-CM

## 2019-04-18 DIAGNOSIS — E1165 Type 2 diabetes mellitus with hyperglycemia: Secondary | ICD-10-CM

## 2019-04-18 NOTE — Progress Notes (Signed)
Patient ID: Ashley Mcgee, female   DOB: 1971-05-01, 48 y.o.   MRN: [id number]   On recent blood work, patient with hemoglobin A1c that was previously at 9.9 in January of this year that is now increased to 11.3 on labs done in June of this year.  Patient will be referred to endocrinology for further evaluation and management of her diabetes.

## 2019-04-30 MED FILL — GABAPENTIN 300 MG CAPSULE: 300 | 30 days supply | Qty: 180 | Fill #3

## 2019-05-09 ENCOUNTER — Ambulatory Visit: Payer: Medicaid Other | Attending: Family Medicine | Admitting: Family Medicine

## 2019-05-09 ENCOUNTER — Other Ambulatory Visit: Payer: Self-pay

## 2019-05-09 ENCOUNTER — Encounter: Payer: Self-pay | Admitting: Family Medicine

## 2019-05-09 VITALS — BP 135/76 | HR 77 | Temp 98.9°F | Ht 70.0 in | Wt 199.2 lb

## 2019-05-09 DIAGNOSIS — E1165 Type 2 diabetes mellitus with hyperglycemia: Secondary | ICD-10-CM | POA: Diagnosis not present

## 2019-05-09 DIAGNOSIS — E1142 Type 2 diabetes mellitus with diabetic polyneuropathy: Secondary | ICD-10-CM

## 2019-05-09 MED ORDER — LANTUS SOLOSTAR 100 UNIT/ML ~~LOC~~ SOPN: 12.0000 [IU] | PEN_INJECTOR | Freq: Every day | SUBCUTANEOUS | 4 refills | Status: DC

## 2019-05-09 MED FILL — LANTUS SOLOSTAR 100 UNITS/M: 100 | 25 days supply | Qty: 3 | Fill #0

## 2019-05-09 NOTE — Progress Notes (Signed)
CBG today was 268  DM f/u

## 2019-05-09 NOTE — Progress Notes (Signed)
Established Patient Office Visit  Subjective:  Patient ID: Ashley Mcgee, female    DOB: [birth year]  Age: 48 y.o. MRN: [id number]  CC:  Chief Complaint  Patient presents with  . Diabetes    HPI Ashley Mcgee presents for follow-up of diabetes s/p new start of Lantus. She reports that her home blood sugars are improved with Lantus use and now in the 160's-170's fasting.  She reports that she has had no additional episodes of falling.  Patient takes her time now when going from a sitting to standing position and does not make quick movements.  She reports that the scabbed areas on her feet/legs from her prior falls are healing.  Overall she feels better.      Patient is wearing a mask but has visible bruising to her left forehead.  Patient reports that she hit her head on the edge of a cabinet door that she forgot to close.  She denies any issues with abuse.  Past Medical History:  Diagnosis Date  . Alcohol dependence in remission (HCC)   . Diabetic peripheral neuropathy (H[facility])   . Hyperlipidemia   . Tobacco dependence   . Type 2 diabetes with complication [facility](HCC)     Past Surgical History:  Procedure Laterality Date  . cataract surgery      Family History  Problem Relation Age of Onset  . Diabetes Mother   . Hypertension Mother   . Diabetes Father   . Hypertension Father     Social History   Tobacco Use  . Smoking status: Current Every Day Smoker    Packs/day: 0.50    Types: Cigarettes  . Smokeless tobacco: Never Used  Substance Use Topics  . Alcohol use: No  . Drug use: Not Currently    Outpatient Medications Prior to Visit  Medication Sig Dispense Refill  . atorvastatin (LIPITOR) 40 MG tablet Take 1 tablet (40 mg total) by mouth daily. To lower cholesterol 30 tablet 5  . Blood Glucose Monitoring Suppl (ACCU-CHEK AVIVA) device Use as instructed 1 each 0  . divalproex (DEPAKOTE) 500 MG DR tablet Take 500 mg by mouth 2 (two) times daily.    Marland Kitchen. gabapentin  (NEURONTIN) 300 MG capsule Take 2 capsules (600 mg total) by mouth 3 (three) times daily. For nerve pain 270 capsule 5  . glipiZIDE (GLUCOTROL) 10 MG tablet Take 1 tablet (10 mg total) by mouth 2 (two) times daily before a meal. 180 tablet 1  . glucose blood (ACCU-CHEK AVIVA) test strip Use as instructed to check blood sugar 2-3 times per day 100 each 12  . Insulin Glargine (LANTUS SOLOSTAR) 100 UNIT/ML Solostar Pen Inject 10 Units into the skin daily. 5 pen PRN  . Insulin Pen Needle (TRUEPLUS PEN NEEDLES) 32G X 4 MM MISC Use to inject insulin daily. 100 each 11  . Lancets (ACCU-CHEK SOFT TOUCH) lancets Use as instructed 100 each 12  . lisinopril (PRINIVIL,ZESTRIL) 2.5 MG tablet Take 1 tablet (2.5 mg total) by mouth daily. 90 tablet 1  . metFORMIN (GLUCOPHAGE) 1000 MG tablet Take 1 tablet (1,000 mg total) by mouth 2 (two) times daily with a meal. 180 tablet 1  . risperiDONE (RISPERDAL) 0.5 MG tablet Take 0.5 mg by mouth daily.    . risperiDONE (RISPERDAL) 2 MG tablet Take 2 mg by mouth at bedtime.    . rosuvastatin (CRESTOR) 20 MG tablet Take 1 tablet (20 mg total) by mouth daily. To lower cholesterol 30 tablet 6  . silver sulfADIAZINE (  SILVADENE) 1 % cream Apply 1 application topically daily. To affected skin area x 10 days then as needed until healed 50 g 2   No facility-administered medications prior to visit.     No Known Allergies  ROS Review of Systems  Constitutional: Positive for fatigue (improved). Negative for chills and fever.  HENT: Negative for sore throat and trouble swallowing.   Eyes: Negative for photophobia and visual disturbance.  Cardiovascular: Negative for chest pain, palpitations and leg swelling.  Gastrointestinal: Negative for abdominal pain, constipation, diarrhea and nausea.  Endocrine: Negative for polydipsia, polyphagia and polyuria.  Genitourinary: Negative for dysuria and frequency.  Musculoskeletal: Negative for arthralgias and back pain.  Skin: Positive  for color change (bruise on left forehead).  Neurological: Positive for light-headedness (with quick movements) and numbness. Negative for dizziness and headaches.  Hematological: Negative for adenopathy. Does not bruise/bleed easily.      Objective:    Physical Exam  Constitutional: She is oriented to person, place, and time. She appears well-developed and well-nourished.  Neck: Normal range of motion. Neck supple.  Cardiovascular: Regular rhythm.  Pulmonary/Chest: Effort normal and breath sounds normal.  Abdominal: Soft. There is no abdominal tenderness. There is no rebound and no guarding.  Musculoskeletal:        General: No tenderness or edema.  Lymphadenopathy:    She has no cervical adenopathy.  Neurological: She is alert and oriented to person, place, and time.  Skin: Skin is warm and dry.  Psychiatric: She has a normal mood and affect. Her behavior is normal.  Nursing note reviewed.   BP 135/76 (BP Location: Left Arm, Patient Position: Sitting, Cuff Size: Large)   Pulse 77   Temp 98.9 F (37.2 C) (Oral)   Ht 5\' 10"  (1.778 m)   Wt 199 lb 3.2 oz (90.4 kg)   SpO2 96%   BMI 28.58 kg/m  Wt Readings from Last 3 Encounters:  05/09/19 199 lb 3.2 oz (90.4 kg)  04/11/19 195 lb (88.5 kg)  12/25/18 201 lb (91.2 kg)     Health Maintenance Due  Topic Date Due  . HIV Screening  11/25/1983  . TETANUS/TDAP  11/25/1987  . PAP SMEAR-Modifier  11/24/1989  . MAMMOGRAM  11/24/2018  . COLONOSCOPY  11/24/2018    There are no preventive care reminders to display for this patient.  Lab Results  Component Value Date   TSH 1.160 07/14/2018   Lab Results  Component Value Date   WBC 7.5 07/14/2018   HGB 11.5 07/14/2018   HCT 33.9 (L) 07/14/2018   MCV 96 07/14/2018   PLT 265 07/14/2018   Lab Results  Component Value Date   NA 135 04/11/2019   K 4.2 04/11/2019   CO2 23 04/11/2019   GLUCOSE 249 (H) 04/11/2019   BUN 8 04/11/2019   CREATININE 0.69 04/11/2019   BILITOT 0.3  04/11/2019   ALKPHOS 79 04/11/2019   AST 23 04/11/2019   ALT 23 04/11/2019   PROT 7.0 04/11/2019   ALBUMIN 4.5 04/11/2019   CALCIUM 10.1 04/11/2019   ANIONGAP 3 (L) 03/21/2014   Lab Results  Component Value Date   CHOL 253 (H) 04/11/2019   Lab Results  Component Value Date   HDL 36 (L) 04/11/2019   Lab Results  Component Value Date   LDLCALC 158 (H) 04/11/2019   Lab Results  Component Value Date   TRIG 294 (H) 04/11/2019   Lab Results  Component Value Date   CHOLHDL 7.0 (H) 04/11/2019  Lab Results  Component Value Date   HGBA1C 11.3 (A) 04/11/2019      Assessment & Plan:  1. Poorly controlled type 2 diabetes mellitus with peripheral neuropathy [facility]) Patient will increase her Lantus to 12 units daily.  Continue monitoring of her blood sugars.  Notify the office if there are issues with hyper or hypo-glycemia.  Continue precautions against falls due to combination of neuropathy and autonomic dysfunction. - Insulin Glargine (LANTUS SOLOSTAR) 100 UNIT/ML Solostar Pen; Inject 12 Units into the skin daily.  Dispense: 5 pen; Refill: 4  An After Visit Summary was printed and given to the patient.  Follow-up: Return in about 3 months (around 08/09/2019) for DM- 2-3 month follow-up.   Antony Blackbird, MD

## 2019-05-28 MED FILL — GABAPENTIN 300 MG CAPSULE: 300 | 30 days supply | Qty: 180 | Fill #4

## 2019-08-09 ENCOUNTER — Other Ambulatory Visit: Payer: Self-pay

## 2019-08-09 ENCOUNTER — Encounter: Payer: Self-pay | Admitting: Family Medicine

## 2019-08-09 ENCOUNTER — Ambulatory Visit: Payer: Medicaid Other | Attending: Family Medicine | Admitting: Family Medicine

## 2019-08-09 DIAGNOSIS — E1142 Type 2 diabetes mellitus with diabetic polyneuropathy: Secondary | ICD-10-CM

## 2019-08-09 DIAGNOSIS — E1165 Type 2 diabetes mellitus with hyperglycemia: Secondary | ICD-10-CM

## 2019-08-21 ENCOUNTER — Encounter: Payer: Self-pay | Admitting: Family Medicine

## 2019-08-21 MED ORDER — GABAPENTIN 300 MG PO CAPS: 600.0000 mg | ORAL_CAPSULE | Freq: Three times a day (TID) | ORAL | 0 refills | Status: DC

## 2019-08-21 NOTE — Progress Notes (Signed)
Patient ID: Ashley Mcgee, female   DOB: 1971-09-16, 48 y.o.   MRN: [id number]   Patient was scheduled for telemedicine visit in follow-up of chronic issues including diabetes with peripheral neuropathy.  Patient was contacted for a visit but reported that she needed to reschedule visit for a different day but does need refill of gabapentin.

## 2019-08-22 MED FILL — GABAPENTIN 300 MG CAPSULE: 300 | 30 days supply | Qty: 180 | Fill #0

## 2019-08-27 ENCOUNTER — Ambulatory Visit: Payer: Medicaid Other | Admitting: Internal Medicine

## 2019-09-17 ENCOUNTER — Ambulatory Visit: Payer: Medicaid Other | Admitting: Internal Medicine

## 2019-09-26 MED FILL — GABAPENTIN 300 MG CAPSULE: 300 | 15 days supply | Qty: 90 | Fill #1

## 2019-10-03 ENCOUNTER — Ambulatory Visit: Payer: Medicaid Other | Admitting: Internal Medicine

## 2020-03-22 ENCOUNTER — Inpatient Hospital Stay (HOSPITAL_COMMUNITY): Payer: Medicaid Other

## 2020-03-22 ENCOUNTER — Emergency Department (HOSPITAL_COMMUNITY): Payer: Medicaid Other

## 2020-03-22 ENCOUNTER — Inpatient Hospital Stay (HOSPITAL_COMMUNITY)
Admission: EM | Admit: 2020-03-22 | Discharge: 2020-03-25 | DRG: 522 | Disposition: A | Discharge status: Home Health | Payer: Medicaid Other | Attending: Family Medicine | Admitting: Family Medicine

## 2020-03-22 ENCOUNTER — Encounter (HOSPITAL_COMMUNITY): Payer: Self-pay | Admitting: Family Medicine

## 2020-03-22 ENCOUNTER — Other Ambulatory Visit: Payer: Self-pay

## 2020-03-22 DIAGNOSIS — Z8249 Family history of ischemic heart disease and other diseases of the circulatory system: Secondary | ICD-10-CM | POA: Diagnosis not present

## 2020-03-22 DIAGNOSIS — E871 Hypo-osmolality and hyponatremia: Secondary | ICD-10-CM

## 2020-03-22 DIAGNOSIS — E785 Hyperlipidemia, unspecified: Secondary | ICD-10-CM

## 2020-03-22 DIAGNOSIS — F17210 Nicotine dependence, cigarettes, uncomplicated: Secondary | ICD-10-CM | POA: Diagnosis present

## 2020-03-22 DIAGNOSIS — E860 Dehydration: Secondary | ICD-10-CM | POA: Diagnosis present

## 2020-03-22 DIAGNOSIS — W010XXA Fall on same level from slipping, tripping and stumbling without subsequent striking against object, initial encounter: Secondary | ICD-10-CM | POA: Diagnosis present

## 2020-03-22 DIAGNOSIS — S72002A Fracture of unspecified part of neck of left femur, initial encounter for closed fracture: Secondary | ICD-10-CM | POA: Diagnosis not present

## 2020-03-22 DIAGNOSIS — D62 Acute posthemorrhagic anemia: Secondary | ICD-10-CM | POA: Diagnosis not present

## 2020-03-22 DIAGNOSIS — Y92009 Unspecified place in unspecified non-institutional (private) residence as the place of occurrence of the external cause: Secondary | ICD-10-CM

## 2020-03-22 DIAGNOSIS — F1021 Alcohol dependence, in remission: Secondary | ICD-10-CM

## 2020-03-22 DIAGNOSIS — F10129 Alcohol abuse with intoxication, unspecified: Secondary | ICD-10-CM | POA: Diagnosis present

## 2020-03-22 DIAGNOSIS — S72012A Unspecified intracapsular fracture of left femur, initial encounter for closed fracture: Secondary | ICD-10-CM | POA: Diagnosis present

## 2020-03-22 DIAGNOSIS — E118 Type 2 diabetes mellitus with unspecified complications: Secondary | ICD-10-CM

## 2020-03-22 DIAGNOSIS — S72001A Fracture of unspecified part of neck of right femur, initial encounter for closed fracture: Secondary | ICD-10-CM

## 2020-03-22 DIAGNOSIS — Z9181 History of falling: Secondary | ICD-10-CM | POA: Diagnosis not present

## 2020-03-22 DIAGNOSIS — Z20822 Contact with and (suspected) exposure to covid-19: Secondary | ICD-10-CM | POA: Diagnosis present

## 2020-03-22 DIAGNOSIS — Z96642 Presence of left artificial hip joint: Secondary | ICD-10-CM

## 2020-03-22 DIAGNOSIS — R296 Repeated falls: Secondary | ICD-10-CM | POA: Diagnosis present

## 2020-03-22 DIAGNOSIS — E1165 Type 2 diabetes mellitus with hyperglycemia: Secondary | ICD-10-CM | POA: Diagnosis present

## 2020-03-22 DIAGNOSIS — W19XXXA Unspecified fall, initial encounter: Secondary | ICD-10-CM

## 2020-03-22 DIAGNOSIS — F17200 Nicotine dependence, unspecified, uncomplicated: Secondary | ICD-10-CM | POA: Diagnosis not present

## 2020-03-22 DIAGNOSIS — E1142 Type 2 diabetes mellitus with diabetic polyneuropathy: Secondary | ICD-10-CM

## 2020-03-22 DIAGNOSIS — Z833 Family history of diabetes mellitus: Secondary | ICD-10-CM | POA: Diagnosis not present

## 2020-03-22 DIAGNOSIS — S72009A Fracture of unspecified part of neck of unspecified femur, initial encounter for closed fracture: Secondary | ICD-10-CM

## 2020-03-22 DIAGNOSIS — I10 Essential (primary) hypertension: Secondary | ICD-10-CM | POA: Diagnosis present

## 2020-03-22 LAB — COMPREHENSIVE METABOLIC PANEL
ALT: 30 U/L (ref 0–44)
AST: 34 U/L (ref 15–41)
Albumin: 4.6 g/dL (ref 3.5–5.0)
Alkaline Phosphatase: 79 U/L (ref 38–126)
Anion gap: 12 (ref 5–15)
BUN: 9 mg/dL (ref 6–20)
CO2: 25 mmol/L (ref 22–32)
Calcium: 9.3 mg/dL (ref 8.9–10.3)
Chloride: 85 mmol/L — ABNORMAL LOW (ref 98–111)
Creatinine, Ser: 0.52 mg/dL (ref 0.44–1.00)
GFR calc Af Amer: >60 mL/min (ref >60)
GFR calc non Af Amer: >60 mL/min (ref >60)
Glucose, Bld: 257 mg/dL — ABNORMAL HIGH (ref 70–99)
Potassium: 3.8 mmol/L (ref 3.5–5.1)
Sodium: 122 mmol/L — ABNORMAL LOW (ref 135–145)
Total Bilirubin: 0.7 mg/dL (ref 0.3–1.2)
Total Protein: 8.5 g/dL — ABNORMAL HIGH (ref 6.5–8.1)

## 2020-03-22 LAB — CBC WITH DIFFERENTIAL/PLATELET
Abs Immature Granulocytes: 0.18 10*3/uL — ABNORMAL HIGH (ref 0.00–0.07)
Basophils Absolute: 0.1 10*3/uL (ref 0.0–0.1)
Basophils Relative: 1 %
Eosinophils Absolute: 0 10*3/uL (ref 0.0–0.5)
Eosinophils Relative: 0 %
HCT: 41.4 % (ref 36.0–46.0)
Hemoglobin: 14.6 g/dL (ref 12.0–15.0)
Immature Granulocytes: 1 %
Lymphocytes Relative: 8 %
Lymphs Abs: 1.2 10*3/uL (ref 0.7–4.0)
MCH: 33.2 pg (ref 26.0–34.0)
MCHC: 35.3 g/dL (ref 30.0–36.0)
MCV: 94.1 fL (ref 80.0–100.0)
Monocytes Absolute: 0.7 10*3/uL (ref 0.1–1.0)
Monocytes Relative: 4 %
Neutro Abs: 13.1 10*3/uL — ABNORMAL HIGH (ref 1.7–7.7)
Neutrophils Relative %: 86 %
Platelets: 305 10*3/uL (ref 150–400)
RBC: 4.4 MIL/uL (ref 3.87–5.11)
RDW: 11.2 % — ABNORMAL LOW (ref 11.5–15.5)
WBC: 15.2 10*3/uL — ABNORMAL HIGH (ref 4.0–10.5)
nRBC: 0 % (ref 0.0–0.2)

## 2020-03-22 LAB — I-STAT BETA HCG BLOOD, ED (MC, WL, AP ONLY): I-stat hCG, quantitative: 10.8 m[IU]/mL — ABNORMAL HIGH (ref <5)

## 2020-03-22 LAB — CK: Total CK: 258 U/L — ABNORMAL HIGH (ref 38–234)

## 2020-03-22 LAB — SARS CORONAVIRUS 2 BY RT PCR (HOSPITAL ORDER, PERFORMED IN ~~LOC~~ HOSPITAL LAB): SARS Coronavirus 2: NEGATIVE

## 2020-03-22 LAB — RAPID URINE DRUG SCREEN, HOSP PERFORMED
Amphetamines: NOT DETECTED
Barbiturates: NOT DETECTED
Benzodiazepines: POSITIVE — AB
Cocaine: NOT DETECTED
Opiates: NOT DETECTED
Tetrahydrocannabinol: NOT DETECTED

## 2020-03-22 LAB — ETHANOL: Alcohol, Ethyl (B): 21 mg/dL — ABNORMAL HIGH (ref <10)

## 2020-03-22 LAB — GLUCOSE, CAPILLARY: Glucose-Capillary: 189 mg/dL — ABNORMAL HIGH (ref 70–99)

## 2020-03-22 MED ORDER — SODIUM CHLORIDE 0.9 % IV BOLUS
1000.0000 mL | Freq: Once | INTRAVENOUS | Status: AC
  Administered 2020-03-22: 1000 mL via INTRAVENOUS

## 2020-03-22 MED ORDER — ACETAMINOPHEN 325 MG PO TABS: 650.0000 mg | ORAL_TABLET | Freq: Four times a day (QID) | ORAL | Status: DC | PRN

## 2020-03-22 MED ORDER — SENNA 8.6 MG PO TABS
1.0000 | ORAL_TABLET | Freq: Every day | ORAL | Status: DC
  Administered 2020-03-24 – 2020-03-25 (×2): 8.6 mg via ORAL
  Filled 2020-03-22 (×2): qty 1

## 2020-03-22 MED ORDER — INSULIN ASPART 100 UNIT/ML ~~LOC~~ SOLN
0.0000 [IU] | SUBCUTANEOUS | Status: DC
  Administered 2020-03-22: 2 [IU] via SUBCUTANEOUS
  Administered 2020-03-23 (×2): 5 [IU] via SUBCUTANEOUS
  Administered 2020-03-23 (×3): 3 [IU] via SUBCUTANEOUS

## 2020-03-22 MED ORDER — TRANEXAMIC ACID-NACL 1000-0.7 MG/100ML-% IV SOLN
1000.0000 mg | INTRAVENOUS | Status: AC
  Administered 2020-03-23: 1000 mg via INTRAVENOUS

## 2020-03-22 MED ORDER — SODIUM CHLORIDE 0.9 % IV SOLN: INTRAVENOUS | Status: DC

## 2020-03-22 MED ORDER — POLYETHYLENE GLYCOL 3350 17 G PO PACK: 17.0000 g | PACK | Freq: Every day | ORAL | Status: DC | PRN

## 2020-03-22 MED ORDER — DIVALPROEX SODIUM 250 MG PO DR TAB
500.0000 mg | DELAYED_RELEASE_TABLET | Freq: Two times a day (BID) | ORAL | Status: DC
  Administered 2020-03-22 – 2020-03-25 (×5): 500 mg via ORAL
  Filled 2020-03-22 (×4): qty 2

## 2020-03-22 MED ORDER — NICOTINE 21 MG/24HR TD PT24
21.0000 mg | MEDICATED_PATCH | Freq: Every day | TRANSDERMAL | Status: DC | PRN
  Administered 2020-03-23: 21 mg via TRANSDERMAL
  Filled 2020-03-22 (×3): qty 1

## 2020-03-22 MED ORDER — CHLORHEXIDINE GLUCONATE 4 % EX LIQD: 60.0000 mL | Freq: Once | CUTANEOUS | Status: DC

## 2020-03-22 MED ORDER — SODIUM CHLORIDE 0.9% FLUSH
3.0000 mL | Freq: Two times a day (BID) | INTRAVENOUS | Status: DC
  Administered 2020-03-24 – 2020-03-25 (×2): 3 mL via INTRAVENOUS

## 2020-03-22 MED ORDER — CEFAZOLIN SODIUM-DEXTROSE 2-4 GM/100ML-% IV SOLN
2.0000 g | INTRAVENOUS | Status: AC
  Administered 2020-03-23: 2 g via INTRAVENOUS

## 2020-03-22 MED ORDER — ADULT MULTIVITAMIN W/MINERALS CH
1.0000 | ORAL_TABLET | Freq: Every day | ORAL | Status: DC
  Administered 2020-03-24 – 2020-03-25 (×2): 1 via ORAL
  Filled 2020-03-22 (×2): qty 1

## 2020-03-22 MED ORDER — ONDANSETRON HCL 4 MG/2ML IJ SOLN
4.0000 mg | Freq: Four times a day (QID) | INTRAMUSCULAR | Status: DC | PRN
  Administered 2020-03-22: 4 mg via INTRAVENOUS
  Filled 2020-03-22: qty 2

## 2020-03-22 MED ORDER — POVIDONE-IODINE 10 % EX SWAB: 2.0000 "application " | Freq: Once | CUTANEOUS | Status: DC

## 2020-03-22 MED ORDER — MIDAZOLAM HCL 2 MG/2ML IJ SOLN
1.0000 mg | Freq: Once | INTRAMUSCULAR | Status: AC
  Administered 2020-03-22: 1 mg via INTRAVENOUS
  Filled 2020-03-22: qty 2

## 2020-03-22 MED ORDER — METOPROLOL TARTRATE 5 MG/5ML IV SOLN: 5.0000 mg | Freq: Four times a day (QID) | INTRAVENOUS | Status: DC | PRN

## 2020-03-22 MED ORDER — GABAPENTIN 100 MG PO CAPS
100.0000 mg | ORAL_CAPSULE | Freq: Three times a day (TID) | ORAL | Status: DC
  Administered 2020-03-22 – 2020-03-25 (×8): 100 mg via ORAL
  Filled 2020-03-22 (×8): qty 1

## 2020-03-22 MED ORDER — FENTANYL CITRATE (PF) 100 MCG/2ML IJ SOLN
100.0000 ug | Freq: Once | INTRAMUSCULAR | Status: AC
  Administered 2020-03-22: 100 ug via INTRAVENOUS
  Filled 2020-03-22: qty 2

## 2020-03-22 MED ORDER — FENTANYL CITRATE (PF) 100 MCG/2ML IJ SOLN: 50.0000 ug | INTRAMUSCULAR | Status: DC | PRN

## 2020-03-22 MED ORDER — FOLIC ACID 1 MG PO TABS
1.0000 mg | ORAL_TABLET | Freq: Every day | ORAL | Status: DC
  Administered 2020-03-24 – 2020-03-25 (×2): 1 mg via ORAL
  Filled 2020-03-22 (×2): qty 1

## 2020-03-22 MED ORDER — DEXTROSE 5 % IV SOLN: 3.0000 g | INTRAVENOUS | Status: DC

## 2020-03-22 MED ORDER — ENSURE PRE-SURGERY PO LIQD
296.0000 mL | Freq: Once | ORAL | Status: AC
  Administered 2020-03-22: 296 mL via ORAL
  Filled 2020-03-22: qty 296

## 2020-03-22 MED ORDER — ACETAMINOPHEN 650 MG RE SUPP: 650.0000 mg | Freq: Four times a day (QID) | RECTAL | Status: DC | PRN

## 2020-03-22 MED ORDER — INSULIN ASPART 100 UNIT/ML ~~LOC~~ SOLN
5.0000 [IU] | Freq: Once | SUBCUTANEOUS | Status: AC
  Administered 2020-03-22: 5 [IU] via SUBCUTANEOUS
  Filled 2020-03-22: qty 0.05

## 2020-03-22 MED ORDER — OXYCODONE HCL 5 MG PO TABS
5.0000 mg | ORAL_TABLET | ORAL | Status: DC | PRN
  Administered 2020-03-22: 10 mg via ORAL
  Administered 2020-03-22: 5 mg via ORAL
  Filled 2020-03-22: qty 1
  Filled 2020-03-22: qty 2

## 2020-03-22 MED ORDER — THIAMINE HCL 100 MG PO TABS
100.0000 mg | ORAL_TABLET | Freq: Every day | ORAL | Status: DC
  Administered 2020-03-24 – 2020-03-25 (×2): 100 mg via ORAL
  Filled 2020-03-22 (×2): qty 1

## 2020-03-22 MED ORDER — ONDANSETRON HCL 4 MG PO TABS: 4.0000 mg | ORAL_TABLET | Freq: Four times a day (QID) | ORAL | Status: DC | PRN

## 2020-03-22 MED ORDER — FENTANYL CITRATE (PF) 100 MCG/2ML IJ SOLN
50.0000 ug | Freq: Once | INTRAMUSCULAR | Status: AC
  Administered 2020-03-22: 50 ug via INTRAVENOUS
  Filled 2020-03-22: qty 2

## 2020-03-22 MED ORDER — ENOXAPARIN SODIUM 40 MG/0.4ML ~~LOC~~ SOLN: 40.0000 mg | SUBCUTANEOUS | Status: DC

## 2020-03-22 MED ORDER — ALBUTEROL SULFATE (2.5 MG/3ML) 0.083% IN NEBU: 2.5000 mg | INHALATION_SOLUTION | RESPIRATORY_TRACT | Status: DC | PRN

## 2020-03-22 NOTE — ED Notes (Signed)
CT taking pt to scan as staff went to transfer pt to room 1310, will transfer after scans are completed.

## 2020-03-22 NOTE — Progress Notes (Signed)
Consult acknowledged. Patient with displaced left femoral neck fracture. CT pending. Injury is unstable and requires THA. Hospitalist is admitting patient for medical optimization. Needs glucose control. Dr. Charlann Boxer to perform surgery tomorrow am. NPO after MN.

## 2020-03-22 NOTE — Plan of Care (Signed)
  Problem: Pain Managment: Goal: General experience of comfort will improve Outcome: Progressing   

## 2020-03-22 NOTE — ED Provider Notes (Addendum)
[facility]-EMERGENCY DEPT Provider Note   CSN: [id number] Arrival date & time: 03/22/20  1345     History Chief Complaint  Patient presents with  . Fall    Ashley Mcgee is a 49 y.o. female.  HPI    49 year old female with history of type 2 diabetes, diabetic neuropathy, alcohol dependence in remission comes in a chief complaint of fall.  Patient reports that she fell around 1:00 in the morning.  She thinks that she fell because of her orthostatic hypotension.  She was unable to get up because of pain in her right hip.  This morning when she woke up, she started hollering and neighbors called for help.  Patient continues to have pain over the right hip area.  Patient denies any headaches, neck pain, numbness, tingling, focal weakness.  She is not on any blood thinners.  Pt has no associated nausea, vomiting, seizures, loss of consciousness or new visual complains, weakness, numbness, dizziness or gait instability.   Past Medical History:  Diagnosis Date  . Alcohol dependence in remission (HCC)   . Diabetic peripheral neuropathy (HCC)   . Hyperlipidemia   . Tobacco dependence   . Type 2 diabetes with complication [facility])     Patient Active Problem List   Diagnosis Date Noted  . Closed left hip fracture (HCC) 03/22/2020    Past Surgical History:  Procedure Laterality Date  . cataract surgery       OB History   No obstetric history on file.     Family History  Problem Relation Age of Onset  . Diabetes Mother   . Hypertension Mother   . Diabetes Father   . Hypertension Father     Social History   Tobacco Use  . Smoking status: Current Every Day Smoker    Packs/day: 0.50    Types: Cigarettes  . Smokeless tobacco: Never Used  Substance Use Topics  . Alcohol use: No  . Drug use: Not Currently    Home Medications Prior to Admission medications   Medication Sig Start Date End Date Taking? Authorizing Provider  ibuprofen (ADVIL)  200 MG tablet Take 200 mg by mouth every 6 (six) hours as needed for headache.   Yes [provider]  lisinopril (PRINIVIL,ZESTRIL) 2.5 MG tablet Take 1 tablet (2.5 mg total) by mouth daily. 11/13/18  Yes Fulp, Cammie, MD  metFORMIN (GLUCOPHAGE) 1000 MG tablet Take 1 tablet (1,000 mg total) by mouth 2 (two) times daily with a meal. 11/13/18  Yes Fulp, Cammie, MD  rosuvastatin (CRESTOR) 20 MG tablet Take 1 tablet (20 mg total) by mouth daily. To lower cholesterol 04/12/19  Yes Fulp, Cammie, MD  atorvastatin (LIPITOR) 40 MG tablet Take 1 tablet (40 mg total) by mouth daily. To lower cholesterol Patient not taking: Reported on 03/22/2020 01/02/19   Fulp, Hewitt Shorts, MD  divalproex (DEPAKOTE) 500 MG DR tablet Take 500 mg by mouth 2 (two) times daily.    [provider]  gabapentin (NEURONTIN) 300 MG capsule Take 2 capsules (600 mg total) by mouth 3 (three) times daily. For nerve pain 08/21/19   Fulp, Cammie, MD  glipiZIDE (GLUCOTROL) 10 MG tablet Take 1 tablet (10 mg total) by mouth 2 (two) times daily before a meal. 11/13/18   Fulp, Cammie, MD  glucose blood (ACCU-CHEK AVIVA) test strip Use as instructed to check blood sugar 2-3 times per day 07/14/18   Fulp, Cammie, MD  Insulin Glargine (LANTUS SOLOSTAR) 100 UNIT/ML Solostar Pen Inject 12 Units  into the skin daily. Patient not taking: Reported on 03/22/2020 05/09/19   Fulp, Cammie, MD  Insulin Pen Needle (TRUEPLUS PEN NEEDLES) 32G X 4 MM MISC Use to inject insulin daily. 04/11/19   Fulp, Cammie, MD  Lancets (ACCU-CHEK SOFT TOUCH) lancets Use as instructed 07/14/18   Fulp, Cammie, MD  silver sulfADIAZINE (SILVADENE) 1 % cream Apply 1 application topically daily. To affected skin area x 10 days then as needed until healed Patient not taking: Reported on 08/09/2019 04/11/19   Cain Saupe, MD    Allergies    Patient has no known allergies.  Review of Systems   Review of Systems  Constitutional: Positive for activity change.  Respiratory:  Negative for shortness of breath.   Cardiovascular: Negative for chest pain.  Gastrointestinal: Negative for nausea and vomiting.  Musculoskeletal: Positive for arthralgias.  All other systems reviewed and are negative.   Physical Exam Updated Vital Signs BP (!) 158/71   Pulse 97   Temp 98.4 F (36.9 C) (Oral)   Resp (!) 24   SpO2 98%   Physical Exam Vitals and nursing note reviewed.  Constitutional:      Appearance: She is well-developed.  HENT:     Head: Normocephalic and atraumatic.  Eyes:     Pupils: Pupils are equal, round, and reactive to light.  Neck:     Comments: No midline c-spine tenderness.  Cardiovascular:     Rate and Rhythm: Normal rate and regular rhythm.     Heart sounds: No murmur.  Pulmonary:     Effort: Pulmonary effort is normal. No respiratory distress.     Breath sounds: Normal breath sounds.  Chest:     Chest wall: No tenderness.  Abdominal:     General: Bowel sounds are normal. There is no distension.     Palpations: Abdomen is soft.     Tenderness: There is no abdominal tenderness.  Musculoskeletal:        General: Tenderness present. No deformity.     Cervical back: Neck supple.     Comments: L sided pelvic pain/hip pain on exam. Patient is laying on her side and unwilling to lay on her back for complete exam.  Skin:    General: Skin is warm and dry.     Findings: No rash.  Neurological:     Mental Status: She is alert and oriented to person, place, and time.     Cranial Nerves: No cranial nerve deficit.     ED Results / Procedures / Treatments   Labs (all labs ordered are listed, but only abnormal results are displayed) Labs Reviewed  COMPREHENSIVE METABOLIC PANEL - Abnormal; Notable for the following components:      Result Value   Sodium 122 (*)    Chloride 85 (*)    Glucose, Bld 257 (*)    Total Protein 8.5 (*)    All other components within normal limits  CBC WITH DIFFERENTIAL/PLATELET - Abnormal; Notable for the  following components:   WBC 15.2 (*)    RDW 11.2 (*)    Neutro Abs 13.1 (*)    Abs Immature Granulocytes 0.18 (*)    All other components within normal limits  CK - Abnormal; Notable for the following components:   Total CK 258 (*)    All other components within normal limits  I-STAT BETA HCG BLOOD, ED ([facility], [facility], [facility] ONLY) - Abnormal; Notable for the following components:   I-stat hCG, quantitative 10.8 (*)    All  other components within normal limits  SARS CORONAVIRUS 2 BY RT PCR (HOSPITAL ORDER, [place] [facility])  RAPID URINE DRUG SCREEN, HOSP PERFORMED  SODIUM, URINE, RANDOM  ETHANOL    EKG EKG Interpretation  Date/Time:  Saturday Mar 22 2020 14:31:39 EDT Ventricular Rate:  96 PR Interval:    QRS Duration: 88 QT Interval:  352 QTC Calculation: 445 R Axis:   78 Text Interpretation: Sinus rhythm Ventricular premature complex Probable anteroseptal infarct, old No acute changes No significant change since last tracing Confirmed by Varney Biles [phone]) on 03/22/2020 3:27:11 PM   Radiology DG Hip Unilat W or Wo Pelvis 2-3 Views Left  Result Date: 03/22/2020 CLINICAL DATA:  Hip pain. EXAM: DG HIP (WITH OR WITHOUT PELVIS) 2-3V LEFT COMPARISON:  None. FINDINGS: There is an acute displaced subcapital fracture of the proximal left femur. There is no dislocation. The fracture plane appears irregular. There are degenerative changes of both hips. IMPRESSION: Acute displaced subcapital fracture of the proximal left femur. The fracture plane appears irregular which may be secondary to impaction. However, an underlying lytic lesion should be considered and can be further evaluated with cross-sectional imaging as clinically indicated. Electronically Signed   By: Constance Holster M.D.   On: 03/22/2020 15:34    Procedures Procedures (including critical care time)  Medications Ordered in ED Medications  0.9 %  sodium chloride infusion (has no administration in time  range)  insulin aspart (novoLOG) injection 5 Units (has no administration in time range)  fentaNYL (SUBLIMAZE) injection 50 mcg (has no administration in time range)  fentaNYL (SUBLIMAZE) injection 100 mcg (100 mcg Intravenous Given 03/22/20 1500)  midazolam (VERSED) injection 1 mg (1 mg Intravenous Given 03/22/20 1501)  sodium chloride 0.9 % bolus 1,000 mL (0 mLs Intravenous Stopped 03/22/20 1744)    ED Course  I have reviewed the triage vital signs and the nursing notes.  Pertinent labs & imaging results that were available during my care of the patient were reviewed by me and considered in my medical decision making (see chart for details).    MDM Rules/Calculators/A&P                      49 year old comes in a chief complaint of fall.  Patient is having left-sided tenderness over the hip.  Neurovascularly she is intact.  She is not complaining of any headaches and there are no red flags suggesting elevated ICP.  Both brain and C-spine have been cleared clinically.  X-rays ordered and she is noted to have right-sided hip fracture.  I spoke with Dr. Lyla Glassing, they will see the patient.  CT scan without contrast of the pelvis has been added because of questionable lytic findings.  Dr. Lyla Glassing is also requested x-rays of the knee, which has been added.  Results from the ER workup discussed with the patient face to face and all questions answered to the best of my ability.   Final Clinical Impression(s) / ED Diagnoses Final diagnoses:  Closed fracture of right hip, initial encounter [facility])    Rx / [place] Orders ED Discharge Orders    None       Varney Biles, MD 03/22/20 [place], [place], MD 03/22/20 1758

## 2020-03-22 NOTE — H&P (Signed)
History and Physical   [name] AOZ:[id number] DOB: 1970-11-15 DOA: 03/22/2020  Referring MD/NP/PA: Dr. Rhunette Croft, EDP PCP: Cain Saupe, MD  Patient coming from: Home  Chief Complaint: Left hip pain  HPI: Ashley Mcgee is a 49 y.o. female with a history of alcohol abuse, poorly-controlled T2DM with diabetic neuropathy, HTN, HLD, tobacco use who presented to the ED from home after a fall last night with left hip pain.   She has limited memory of the events leading to a fall in her bedroom on a concrete floor last night around 1am. She had had 1 beer earlier, thinks she tripped over her shoes falling on the left hip and noticed immediate severe nonradiating sharp pain in the left hip. This caused her to be unable to get up, though finally she called and got help to get to the ED. She has fallen many times over the previous months, feels unsteady on her feet due to neuropathy, and did not pass out or lose consciousness. She's not been eating or drinking much over the past 24 hours, is very thirsty now. LMP was in her 30's.   ED Course: Left femoral neck fracture on CT. Na 122, Cr wnl, WBC elevated, CBC otherwise unremarkable. CK very mildly elevated. Afebrile. Hospitalists called for admission with orthopedic planning THA 5/30.  Review of Systems: Denies fever, chills, weight loss, changes in vision or hearing, headache, cough, sore throat, chest pain, palpitations, shortness of breath, abdominal pain, nausea, vomiting, changes in bowel habits, blood in stool, change in bladder habits, she stays thirsty, No myalgias, arthralgias (prior to fall), and rash, and per HPI. All others reviewed and are negative.   Past Medical History:  Diagnosis Date  . Alcohol dependence in remission (HCC)   . Diabetic peripheral neuropathy (HCC)   . Hyperlipidemia   . Tobacco dependence   . Type 2 diabetes with complication [facility])    Past Surgical History:  Procedure Laterality Date  . cataract surgery       - Smoked about 1 ppd since she was 18, denies any other drugs. Drinks 1 beer per day "once in a blue moon," not every day.   reports that she has been smoking cigarettes. She has been smoking about 0.50 packs per day. She has never used smokeless tobacco. She reports previous drug use. She reports that she does not drink alcohol. No Known Allergies Family History  Problem Relation Age of Onset  . Diabetes Mother   . Hypertension Mother   . Diabetes Father   . Hypertension Father    - Family history otherwise reviewed and not pertinent.   Prior to Admission medications   Medication Sig Start Date End Date Taking? Authorizing Provider  ibuprofen (ADVIL) 200 MG tablet Take 200 mg by mouth every 6 (six) hours as needed for headache.   Yes [provider]  lisinopril (PRINIVIL,ZESTRIL) 2.5 MG tablet Take 1 tablet (2.5 mg total) by mouth daily. 11/13/18  Yes Fulp, Cammie, MD  metFORMIN (GLUCOPHAGE) 1000 MG tablet Take 1 tablet (1,000 mg total) by mouth 2 (two) times daily with a meal. 11/13/18  Yes Fulp, Cammie, MD  rosuvastatin (CRESTOR) 20 MG tablet Take 1 tablet (20 mg total) by mouth daily. To lower cholesterol 04/12/19  Yes Fulp, Cammie, MD  atorvastatin (LIPITOR) 40 MG tablet Take 1 tablet (40 mg total) by mouth daily. To lower cholesterol Patient not taking: Reported on 03/22/2020 01/02/19   Fulp, Hewitt Shorts, MD  divalproex (DEPAKOTE) 500 MG DR tablet Take  500 mg by mouth 2 (two) times daily.    [provider]  gabapentin (NEURONTIN) 300 MG capsule Take 2 capsules (600 mg total) by mouth 3 (three) times daily. For nerve pain 08/21/19   Fulp, Cammie, MD  glipiZIDE (GLUCOTROL) 10 MG tablet Take 1 tablet (10 mg total) by mouth 2 (two) times daily before a meal. 11/13/18   Fulp, Cammie, MD  glucose blood (ACCU-CHEK AVIVA) test strip Use as instructed to check blood sugar 2-3 times per day 07/14/18   Fulp, Cammie, MD  Insulin Glargine (LANTUS SOLOSTAR) 100 UNIT/ML Solostar Pen Inject  12 Units into the skin daily. Patient not taking: Reported on 03/22/2020 05/09/19   Fulp, Cammie, MD  Insulin Pen Needle (TRUEPLUS PEN NEEDLES) 32G X 4 MM MISC Use to inject insulin daily. 04/11/19   Fulp, Cammie, MD  Lancets (ACCU-CHEK SOFT TOUCH) lancets Use as instructed 07/14/18   Fulp, Cammie, MD  silver sulfADIAZINE (SILVADENE) 1 % cream Apply 1 application topically daily. To affected skin area x 10 days then as needed until healed Patient not taking: Reported on 08/09/2019 04/11/19   Cain Saupe, MD    Physical Exam: Vitals:   03/22/20 1430 03/22/20 1630 03/22/20 1700 03/22/20 1804  BP: (!) 161/77 (!) 179/99 (!) 158/71 (!) 151/96  Pulse: 100 (!) 110 97 90  Resp: 18 (!) 23 (!) 24 20  Temp:      TempSrc:      SpO2: 96% 98% 98% 98%   Constitutional: 49 y.o. female in no distress, calm demeanor Eyes: Lids and conjunctivae normal, PERRL ENMT: Mucous membranes are moist. Posterior pharynx clear of any exudate or lesions. Poor dentition.  Neck: normal, supple, no masses, no thyromegaly Respiratory: Non-labored breathing room air without accessory muscle use. Clear breath sounds to auscultation bilaterally Cardiovascular: Regular rate and rhythm, no murmurs, rubs, or gallops. No carotid bruits. No JVD. No LE edema. Palpable pedal pulses. Abdomen: Normoactive bowel sounds. No tenderness, non-distended, and no masses palpated. No hepatosplenomegaly. GU: No indwelling catheter Musculoskeletal: No clubbing / cyanosis. LLE externally rotated and shortened. No other joint deformity upper and lower extremities. Normal muscle tone.  Skin: Warm, dry. No rashes, wounds, or ulcers. No significant lesions noted.  Neurologic: CN II-XII grossly intact. Decreased sensation in stocking distribution on feet symmetrically, warm feet, motor function preserved distally. No focal deficits in motor strength or sensation in all extremities.  Psychiatric: Alert and oriented x3. Normal judgment and insight. Mood  anxious with broad affect.   Labs on Admission: I have personally reviewed following labs and imaging studies  CBC: Recent Labs  Lab 03/22/20 1419  WBC 15.2*  NEUTROABS 13.1*  HGB 14.6  HCT 41.4  MCV 94.1  PLT 305   Basic Metabolic Panel: Recent Labs  Lab 03/22/20 1419  NA 122*  K 3.8  CL 85*  CO2 25  GLUCOSE 257*  BUN 9  CREATININE 0.52  CALCIUM 9.3   GFR: CrCl cannot be calculated (Unknown ideal weight.). Liver Function Tests: Recent Labs  Lab 03/22/20 1419  AST 34  ALT 30  ALKPHOS 79  BILITOT 0.7  PROT 8.5*  ALBUMIN 4.6   No results for input(s): LIPASE, AMYLASE in the last 168 hours. No results for input(s): AMMONIA in the last 168 hours. Coagulation Profile: No results for input(s): INR, PROTIME in the last 168 hours. Cardiac Enzymes: Recent Labs  Lab 03/22/20 1419  CKTOTAL 258*   Urine analysis:    Component Value Date/Time   COLORURINE  Yellow 03/21/2014 1209   APPEARANCEUR Clear 03/21/2014 1209   LABSPEC 1.015 03/21/2014 1209   PHURINE 5.0 03/21/2014 1209   GLUCOSEU >=500 03/21/2014 1209   HGBUR Negative 03/21/2014 1209   BILIRUBINUR negative 12/25/2018 1413   BILIRUBINUR Negative 03/21/2014 1209   KETONESUR negative 12/25/2018 1413   KETONESUR Negative 03/21/2014 1209   PROTEINUR Negative 03/21/2014 1209   UROBILINOGEN 0.2 12/25/2018 1413   NITRITE Negative 12/25/2018 1413   NITRITE Negative 03/21/2014 1209   LEUKOCYTESUR Negative 12/25/2018 1413   LEUKOCYTESUR Negative 03/21/2014 1209   Radiological Exams on Admission: DG Hip Unilat W or Wo Pelvis 2-3 Views Left  Result Date: 03/22/2020 CLINICAL DATA:  Hip pain. EXAM: DG HIP (WITH OR WITHOUT PELVIS) 2-3V LEFT COMPARISON:  None. FINDINGS: There is an acute displaced subcapital fracture of the proximal left femur. There is no dislocation. The fracture plane appears irregular. There are degenerative changes of both hips. IMPRESSION: Acute displaced subcapital fracture of the proximal  left femur. The fracture plane appears irregular which may be secondary to impaction. However, an underlying lytic lesion should be considered and can be further evaluated with cross-sectional imaging as clinically indicated. Electronically Signed   By: Katherine Mantle M.D.   On: 03/22/2020 15:34   EKG: Independently reviewed. NSR w/borderline tachycardia, late RWP, PVC, no ST deviations.   Assessment/Plan Principal Problem:   Closed left hip fracture (HCC) Active Problems:   Alcohol dependence in remission (HCC)   Hyperlipidemia   Diabetic peripheral neuropathy (HCC)   Tobacco dependence   Type 2 diabetes with complication (HCC)   Hyponatremia   Left hip fracture due to fall at home:  - Planning THA per orthopedics 5/30, NPO p MN.  - ECG and functional status suggest she's at acceptable risk for this procedure with improvement in glycemic control as below.  - PT/OT postop, weight bearing at that time per orthopedics.  - Fall precautions. - CT ordered to r/o lytic lesion on XR as cause of pathologic fracture.  T2DM with diabetic neuropathy uncontrolled with hyperglycemia:  - Will continue to be a fall risk. Will restart gabapentin at lower dose as she's not been taking this regularly.  - Give 5u novolog now.  - Hold glipizide, metformin, states she's not taking lantus as it is on her MAR. Will give SSI q4h as she'll be NPO p MN.   Hyponatremia: Exaggerated by hyperglycemia, corrects to ~125. Suspected to be acute due to dehydration as this does not appear to be a chronic issue.  - Check urine Na.  - Given 1L NS, will continue isotonic saline as she'll be NPO, and check BMP in AM.   HLD:  - Pt not taking statin  HTN:  - Pt not taking lisinopril  Tobacco use: ~1ppd - Nicotine patch prn  History of alcohol abuse:  - Check EtOH level if possible on prior collection. Based on her report, do not need CIWA monitoring, but will monitor clinically.  DVT prophylaxis: Lovenox    Code Status: Full  Family Communication: None at bedside Disposition Plan: Inpatient med-surg, then per PT/OT postop Consults called: Orthopedics, Dr. Linna Caprice by EDP  Admission status: Inpatient   The appropriate admission status for this patient is INPATIENT. Inpatient status is judged to be reasonable and necessary in order to provide the required intensity of service to ensure the patient's safety. The patient's presenting symptoms, physical exam findings, and initial radiographic and laboratory data in the context of their chronic comorbidities is felt to place  them at high risk for further clinical deterioration. Furthermore, it is not anticipated that the patient will be medically stable for discharge from the hospital within 2 midnights of admission. The following factors support the admission status of inpatient.    The patient's presenting symptoms include Left hip pain.  The worrisome physical exam findings include LLE deformity.  The initial radiographic and laboratory data are worrisome because of left femoral neck fracture, severe hyponatremia, hyperglycemia.  The chronic co-morbidities include uncontrolled diabetes, alcohol abuse.  Patient requires inpatient status due to high intensity of service, high risk for further deterioration and high frequency of surveillance required.  I certify that at the point of admission it is my clinical judgment that the patient will require inpatient hospital care spanning beyond 2 midnights from the point of admission.      Patrecia Pour, MD [facility] [email or web address] 03/22/2020, 6:16 PM

## 2020-03-22 NOTE — ED Triage Notes (Addendum)
Pt BIB EMS from home. Pt c/o fall last night, pt states she could not get up, neighbor heard her calling out for help and called EMS. Pt c/o left hip pain, and left groin pain. Pt denies LOC, denies hitting head. Pt states she has frequent falls. Pt is diabetic.   CBG 300  EMS gave 100 mcg fentanyl IM

## 2020-03-23 ENCOUNTER — Encounter (HOSPITAL_COMMUNITY)
Admission: EM | Disposition: A | Discharge status: Home Health | Payer: Self-pay | Source: Home / Self Care | Attending: Family Medicine

## 2020-03-23 ENCOUNTER — Inpatient Hospital Stay (HOSPITAL_COMMUNITY): Payer: Medicaid Other

## 2020-03-23 ENCOUNTER — Inpatient Hospital Stay (HOSPITAL_COMMUNITY): Payer: Medicaid Other | Admitting: Certified Registered"

## 2020-03-23 HISTORY — PX: TOTAL HIP ARTHROPLASTY: SHX124

## 2020-03-23 LAB — GLUCOSE, CAPILLARY
Glucose-Capillary: 211 mg/dL — ABNORMAL HIGH (ref 70–99)
Glucose-Capillary: 239 mg/dL — ABNORMAL HIGH (ref 70–99)
Glucose-Capillary: 242 mg/dL — ABNORMAL HIGH (ref 70–99)
Glucose-Capillary: 252 mg/dL — ABNORMAL HIGH (ref 70–99)
Glucose-Capillary: 252 mg/dL — ABNORMAL HIGH (ref 70–99)
Glucose-Capillary: 256 mg/dL — ABNORMAL HIGH (ref 70–99)
Glucose-Capillary: 256 mg/dL — ABNORMAL HIGH (ref 70–99)

## 2020-03-23 LAB — CBC
HCT: 36.9 % (ref 36.0–46.0)
Hemoglobin: 12.6 g/dL (ref 12.0–15.0)
MCH: 32.7 pg (ref 26.0–34.0)
MCHC: 34.1 g/dL (ref 30.0–36.0)
MCV: 95.8 fL (ref 80.0–100.0)
Platelets: 237 10*3/uL (ref 150–400)
RBC: 3.85 MIL/uL — ABNORMAL LOW (ref 3.87–5.11)
RDW: 11.5 % (ref 11.5–15.5)
WBC: 13 10*3/uL — ABNORMAL HIGH (ref 4.0–10.5)
nRBC: 0 % (ref 0.0–0.2)

## 2020-03-23 LAB — POCT I-STAT, CHEM 8
BUN: 6 mg/dL (ref 6–20)
Calcium, Ion: 1.13 mmol/L — ABNORMAL LOW (ref 1.15–1.40)
Chloride: 91 mmol/L — ABNORMAL LOW (ref 98–111)
Creatinine, Ser: 0.7 mg/dL (ref 0.44–1.00)
Glucose, Bld: 209 mg/dL — ABNORMAL HIGH (ref 70–99)
HCT: 33 % — ABNORMAL LOW (ref 36.0–46.0)
Hemoglobin: 11.2 g/dL — ABNORMAL LOW (ref 12.0–15.0)
Potassium: 3.7 mmol/L (ref 3.5–5.1)
Sodium: 129 mmol/L — ABNORMAL LOW (ref 135–145)
TCO2: 23 mmol/L (ref 22–32)

## 2020-03-23 LAB — HIV ANTIBODY (ROUTINE TESTING W REFLEX): HIV Screen 4th Generation wRfx: NONREACTIVE

## 2020-03-23 LAB — BASIC METABOLIC PANEL
Anion gap: 9 (ref 5–15)
BUN: 8 mg/dL (ref 6–20)
CO2: 25 mmol/L (ref 22–32)
Calcium: 8.2 mg/dL — ABNORMAL LOW (ref 8.9–10.3)
Chloride: 92 mmol/L — ABNORMAL LOW (ref 98–111)
Creatinine, Ser: 0.79 mg/dL (ref 0.44–1.00)
GFR calc Af Amer: >60 mL/min (ref >60)
GFR calc non Af Amer: >60 mL/min (ref >60)
Glucose, Bld: 224 mg/dL — ABNORMAL HIGH (ref 70–99)
Potassium: 3.7 mmol/L (ref 3.5–5.1)
Sodium: 126 mmol/L — ABNORMAL LOW (ref 135–145)

## 2020-03-23 LAB — PROTIME-INR
INR: 1.1 (ref 0.8–1.2)
Prothrombin Time: 13.8 seconds (ref 11.4–15.2)

## 2020-03-23 LAB — SODIUM, URINE, RANDOM: Sodium, Ur: 48 mmol/L

## 2020-03-23 LAB — MRSA PCR SCREENING: MRSA by PCR: NEGATIVE

## 2020-03-23 SURGERY — ARTHROPLASTY, HIP, TOTAL, ANTERIOR APPROACH
Anesthesia: General | Site: Hip | Laterality: Left

## 2020-03-23 MED ORDER — ROCURONIUM BROMIDE 10 MG/ML (PF) SYRINGE
PREFILLED_SYRINGE | INTRAVENOUS | Status: DC | PRN
  Administered 2020-03-23: 10 mg via INTRAVENOUS
  Administered 2020-03-23: 50 mg via INTRAVENOUS

## 2020-03-23 MED ORDER — SUGAMMADEX SODIUM 500 MG/5ML IV SOLN
INTRAVENOUS | Status: DC | PRN
Start: 2020-03-23 — End: 2020-03-23
  Administered 2020-03-23: 300 mg via INTRAVENOUS

## 2020-03-23 MED ORDER — DOCUSATE SODIUM 100 MG PO CAPS
100.0000 mg | ORAL_CAPSULE | Freq: Two times a day (BID) | ORAL | Status: DC
  Administered 2020-03-23 – 2020-03-25 (×4): 100 mg via ORAL
  Filled 2020-03-23 (×4): qty 1

## 2020-03-23 MED ORDER — ASPIRIN 81 MG PO CHEW
81.0000 mg | CHEWABLE_TABLET | Freq: Two times a day (BID) | ORAL | Status: DC
  Administered 2020-03-23 – 2020-03-25 (×4): 81 mg via ORAL
  Filled 2020-03-23 (×4): qty 1

## 2020-03-23 MED ORDER — SODIUM CHLORIDE 0.9 % IR SOLN
Status: DC | PRN
  Administered 2020-03-23: 1000 mL

## 2020-03-23 MED ORDER — ACETAMINOPHEN 325 MG PO TABS: 325.0000 mg | ORAL_TABLET | Freq: Four times a day (QID) | ORAL | Status: DC | PRN

## 2020-03-23 MED ORDER — PROPOFOL 1000 MG/100ML IV EMUL
INTRAVENOUS | Status: AC
  Filled 2020-03-23: qty 300

## 2020-03-23 MED ORDER — HYDROMORPHONE HCL 1 MG/ML IJ SOLN
INTRAMUSCULAR | Status: AC
  Filled 2020-03-23: qty 1

## 2020-03-23 MED ORDER — METHOCARBAMOL 500 MG IVPB - SIMPLE MED
500.0000 mg | Freq: Four times a day (QID) | INTRAVENOUS | Status: DC | PRN
  Administered 2020-03-23: 500 mg via INTRAVENOUS
  Filled 2020-03-23: qty 50
  Filled 2020-03-23: qty 500

## 2020-03-23 MED ORDER — HYDROCODONE-ACETAMINOPHEN 7.5-325 MG PO TABS
1.0000 | ORAL_TABLET | ORAL | Status: DC | PRN
  Administered 2020-03-23 – 2020-03-24 (×4): 2 via ORAL
  Filled 2020-03-23 (×4): qty 2

## 2020-03-23 MED ORDER — MORPHINE SULFATE (PF) 2 MG/ML IV SOLN: 0.5000 mg | INTRAVENOUS | Status: DC | PRN

## 2020-03-23 MED ORDER — STERILE WATER FOR IRRIGATION IR SOLN
Status: DC | PRN
  Administered 2020-03-23: 2000 mL

## 2020-03-23 MED ORDER — CEFAZOLIN SODIUM-DEXTROSE 2-4 GM/100ML-% IV SOLN
INTRAVENOUS | Status: AC
  Filled 2020-03-23: qty 100

## 2020-03-23 MED ORDER — HYDROMORPHONE HCL 1 MG/ML IJ SOLN
0.2500 mg | INTRAMUSCULAR | Status: DC | PRN
  Administered 2020-03-23 (×2): 0.5 mg via INTRAVENOUS

## 2020-03-23 MED ORDER — HYDROCODONE-ACETAMINOPHEN 5-325 MG PO TABS
1.0000 | ORAL_TABLET | ORAL | Status: DC | PRN
  Administered 2020-03-24 – 2020-03-25 (×2): 2 via ORAL
  Filled 2020-03-23 (×2): qty 2

## 2020-03-23 MED ORDER — TRANEXAMIC ACID-NACL 1000-0.7 MG/100ML-% IV SOLN
1000.0000 mg | Freq: Once | INTRAVENOUS | Status: AC
  Administered 2020-03-23: 1000 mg via INTRAVENOUS
  Filled 2020-03-23: qty 100

## 2020-03-23 MED ORDER — PROPOFOL 10 MG/ML IV BOLUS
INTRAVENOUS | Status: AC
  Filled 2020-03-23: qty 20

## 2020-03-23 MED ORDER — PHENYLEPHRINE HCL-NACL 10-0.9 MG/250ML-% IV SOLN
INTRAVENOUS | Status: DC | PRN
  Administered 2020-03-23: 25 ug/min via INTRAVENOUS

## 2020-03-23 MED ORDER — ALUM & MAG HYDROXIDE-SIMETH 200-200-20 MG/5ML PO SUSP: 30.0000 mL | ORAL | Status: DC | PRN

## 2020-03-23 MED ORDER — METOCLOPRAMIDE HCL 5 MG PO TABS: 5.0000 mg | ORAL_TABLET | Freq: Three times a day (TID) | ORAL | Status: DC | PRN

## 2020-03-23 MED ORDER — POLYETHYLENE GLYCOL 3350 17 G PO PACK: 17.0000 g | PACK | Freq: Every day | ORAL | Status: DC | PRN

## 2020-03-23 MED ORDER — METOCLOPRAMIDE HCL 5 MG/ML IJ SOLN: 5.0000 mg | Freq: Three times a day (TID) | INTRAMUSCULAR | Status: DC | PRN

## 2020-03-23 MED ORDER — OXYCODONE HCL 5 MG PO TABS: 5.0000 mg | ORAL_TABLET | Freq: Once | ORAL | Status: DC | PRN

## 2020-03-23 MED ORDER — ONDANSETRON HCL 4 MG/2ML IJ SOLN: 4.0000 mg | Freq: Once | INTRAMUSCULAR | Status: DC | PRN

## 2020-03-23 MED ORDER — PANTOPRAZOLE SODIUM 40 MG PO TBEC
40.0000 mg | DELAYED_RELEASE_TABLET | Freq: Every day | ORAL | Status: DC
  Administered 2020-03-23 – 2020-03-25 (×3): 40 mg via ORAL
  Filled 2020-03-23 (×3): qty 1

## 2020-03-23 MED ORDER — EPHEDRINE SULFATE-NACL 50-0.9 MG/10ML-% IV SOSY
PREFILLED_SYRINGE | INTRAVENOUS | Status: DC | PRN
  Administered 2020-03-23: 10 mg via INTRAVENOUS

## 2020-03-23 MED ORDER — MENTHOL 3 MG MT LOZG: 1.0000 | LOZENGE | OROMUCOSAL | Status: DC | PRN

## 2020-03-23 MED ORDER — PROPOFOL 500 MG/50ML IV EMUL
INTRAVENOUS | Status: AC
  Filled 2020-03-23: qty 100

## 2020-03-23 MED ORDER — SODIUM CHLORIDE 0.9 % IV SOLN: INTRAVENOUS | Status: DC

## 2020-03-23 MED ORDER — FENTANYL CITRATE (PF) 250 MCG/5ML IJ SOLN
INTRAMUSCULAR | Status: AC
  Filled 2020-03-23: qty 5

## 2020-03-23 MED ORDER — LIDOCAINE 2% (20 MG/ML) 5 ML SYRINGE
INTRAMUSCULAR | Status: DC | PRN
  Administered 2020-03-23: 100 mg via INTRAVENOUS
  Administered 2020-03-23: 40 mg via INTRAVENOUS

## 2020-03-23 MED ORDER — METHOCARBAMOL 500 MG PO TABS: 500.0000 mg | ORAL_TABLET | Freq: Four times a day (QID) | ORAL | Status: DC | PRN

## 2020-03-23 MED ORDER — MIDAZOLAM HCL 2 MG/2ML IJ SOLN
INTRAMUSCULAR | Status: DC | PRN
  Administered 2020-03-23 (×2): 1 mg via INTRAVENOUS

## 2020-03-23 MED ORDER — ALBUMIN HUMAN 5 % IV SOLN
INTRAVENOUS | Status: AC
  Filled 2020-03-23: qty 250

## 2020-03-23 MED ORDER — PROPOFOL 10 MG/ML IV BOLUS
INTRAVENOUS | Status: DC | PRN
  Administered 2020-03-23: 150 mg via INTRAVENOUS

## 2020-03-23 MED ORDER — TRANEXAMIC ACID-NACL 1000-0.7 MG/100ML-% IV SOLN
INTRAVENOUS | Status: AC
  Filled 2020-03-23: qty 100

## 2020-03-23 MED ORDER — PROPOFOL 1000 MG/100ML IV EMUL
INTRAVENOUS | Status: AC
  Filled 2020-03-23: qty 100

## 2020-03-23 MED ORDER — INSULIN ASPART 100 UNIT/ML ~~LOC~~ SOLN
0.0000 [IU] | Freq: Every day | SUBCUTANEOUS | Status: DC
  Administered 2020-03-23 – 2020-03-24 (×2): 3 [IU] via SUBCUTANEOUS

## 2020-03-23 MED ORDER — MORPHINE SULFATE (PF) 4 MG/ML IV SOLN: 0.5000 mg | INTRAVENOUS | Status: DC | PRN

## 2020-03-23 MED ORDER — ONDANSETRON HCL 4 MG PO TABS: 4.0000 mg | ORAL_TABLET | Freq: Four times a day (QID) | ORAL | Status: DC | PRN

## 2020-03-23 MED ORDER — INSULIN ASPART 100 UNIT/ML ~~LOC~~ SOLN
0.0000 [IU] | Freq: Three times a day (TID) | SUBCUTANEOUS | Status: DC
  Administered 2020-03-24: 8 [IU] via SUBCUTANEOUS
  Administered 2020-03-24 (×2): 3 [IU] via SUBCUTANEOUS
  Administered 2020-03-25 (×2): 8 [IU] via SUBCUTANEOUS

## 2020-03-23 MED ORDER — MIDAZOLAM HCL 2 MG/2ML IJ SOLN
INTRAMUSCULAR | Status: AC
  Filled 2020-03-23: qty 2

## 2020-03-23 MED ORDER — OXYCODONE HCL 5 MG/5ML PO SOLN: 5.0000 mg | Freq: Once | ORAL | Status: DC | PRN

## 2020-03-23 MED ORDER — ONDANSETRON HCL 4 MG/2ML IJ SOLN: 4.0000 mg | Freq: Four times a day (QID) | INTRAMUSCULAR | Status: DC | PRN

## 2020-03-23 MED ORDER — CEFAZOLIN SODIUM-DEXTROSE 1-4 GM/50ML-% IV SOLN
1.0000 g | Freq: Four times a day (QID) | INTRAVENOUS | Status: AC
  Administered 2020-03-23 (×2): 1 g via INTRAVENOUS
  Filled 2020-03-23 (×2): qty 50

## 2020-03-23 MED ORDER — ALBUMIN HUMAN 5 % IV SOLN: INTRAVENOUS | Status: DC | PRN

## 2020-03-23 MED ORDER — INSULIN ASPART 100 UNIT/ML ~~LOC~~ SOLN
SUBCUTANEOUS | Status: AC
  Filled 2020-03-23: qty 1

## 2020-03-23 MED ORDER — DEXAMETHASONE SODIUM PHOSPHATE 10 MG/ML IJ SOLN
INTRAMUSCULAR | Status: DC | PRN
  Administered 2020-03-23: 4 mg via INTRAVENOUS

## 2020-03-23 MED ORDER — PHENOL 1.4 % MT LIQD
1.0000 | OROMUCOSAL | Status: DC | PRN
  Filled 2020-03-23: qty 177

## 2020-03-23 MED ORDER — ONDANSETRON HCL 4 MG/2ML IJ SOLN
INTRAMUSCULAR | Status: DC | PRN
  Administered 2020-03-23: 4 mg via INTRAVENOUS

## 2020-03-23 MED ORDER — FENTANYL CITRATE (PF) 250 MCG/5ML IJ SOLN
INTRAMUSCULAR | Status: DC | PRN
  Administered 2020-03-23 (×2): 50 ug via INTRAVENOUS
  Administered 2020-03-23: 150 ug via INTRAVENOUS
  Administered 2020-03-23 (×2): 50 ug via INTRAVENOUS
  Administered 2020-03-23: 100 ug via INTRAVENOUS
  Administered 2020-03-23: 50 ug via INTRAVENOUS

## 2020-03-23 SURGICAL SUPPLY — 46 items
BAG ZIPLOCK 12X15 (MISCELLANEOUS) ×2 IMPLANT
BLADE SAG 18X100X1.27 (BLADE) ×3 IMPLANT
BLADE SURG SZ10 CARB STEEL (BLADE) ×6 IMPLANT
COVER PERINEAL POST (MISCELLANEOUS) ×3 IMPLANT
COVER SURGICAL LIGHT HANDLE (MISCELLANEOUS) ×3 IMPLANT
CUP ACETBLR 52 OD PINNACLE (Hips) ×2 IMPLANT
DERMABOND ADVANCED (GAUZE/BANDAGES/DRESSINGS) ×2
DERMABOND ADVANCED .7 DNX12 (GAUZE/BANDAGES/DRESSINGS) ×1 IMPLANT
DRAPE STERI IOBAN 125X83 (DRAPES) ×3 IMPLANT
DRAPE U-SHAPE 47X51 STRL (DRAPES) ×6 IMPLANT
DRESSING AQUACEL AG SP 3.5X10 (GAUZE/BANDAGES/DRESSINGS) ×1 IMPLANT
DRSG AQUACEL AG SP 3.5X10 (GAUZE/BANDAGES/DRESSINGS) ×3
DURAPREP 26ML APPLICATOR (WOUND CARE) ×3 IMPLANT
ELECT REM PT RETURN 15FT ADLT (MISCELLANEOUS) ×3 IMPLANT
ELIMINATOR HOLE APEX DEPUY (Hips) ×2 IMPLANT
GLOVE BIO SURGEON STRL SZ 6 (GLOVE) ×6 IMPLANT
GLOVE BIOGEL PI IND STRL 6 (GLOVE) IMPLANT
GLOVE BIOGEL PI IND STRL 6.5 (GLOVE) ×1 IMPLANT
GLOVE BIOGEL PI IND STRL 7.0 (GLOVE) IMPLANT
GLOVE BIOGEL PI IND STRL 7.5 (GLOVE) ×1 IMPLANT
GLOVE BIOGEL PI IND STRL 8.5 (GLOVE) ×1 IMPLANT
GLOVE BIOGEL PI INDICATOR 6 (GLOVE) ×4
GLOVE BIOGEL PI INDICATOR 6.5 (GLOVE) ×2
GLOVE BIOGEL PI INDICATOR 7.0 (GLOVE) ×2
GLOVE BIOGEL PI INDICATOR 7.5 (GLOVE) ×6
GLOVE BIOGEL PI INDICATOR 8.5 (GLOVE) ×2
GLOVE ECLIPSE 8.0 STRL XLNG CF (GLOVE) ×6 IMPLANT
GLOVE ORTHO TXT STRL SZ7.5 (GLOVE) ×6 IMPLANT
GOWN STRL REUS W/TWL LRG LVL3 (GOWN DISPOSABLE) ×6 IMPLANT
GOWN STRL REUS W/TWL XL LVL3 (GOWN DISPOSABLE) ×5 IMPLANT
HEAD CERAMIC DELTA 36 PLUS 1.5 (Hips) ×2 IMPLANT
HOLDER FOLEY CATH W/STRAP (MISCELLANEOUS) ×3 IMPLANT
KIT TURNOVER KIT A (KITS) ×2 IMPLANT
LINER NEUTRAL 52X36MM PLUS 4 (Liner) ×2 IMPLANT
PACK ANTERIOR HIP CUSTOM (KITS) ×3 IMPLANT
PENCIL SMOKE EVACUATOR (MISCELLANEOUS) ×2 IMPLANT
SCREW 6.5MMX30MM (Screw) ×2 IMPLANT
STEM FEMORAL SZ6 HIGH ACTIS (Stem) ×2 IMPLANT
SUT MNCRL AB 4-0 PS2 18 (SUTURE) ×3 IMPLANT
SUT STRATAFIX 0 PDS 27 VIOLET (SUTURE) ×3
SUT VIC AB 1 CT1 36 (SUTURE) ×9 IMPLANT
SUT VIC AB 2-0 CT1 27 (SUTURE) ×6
SUT VIC AB 2-0 CT1 TAPERPNT 27 (SUTURE) ×2 IMPLANT
SUTURE STRATFX 0 PDS 27 VIOLET (SUTURE) ×1 IMPLANT
TRAY FOLEY MTR SLVR 14FR STAT (SET/KITS/TRAYS/PACK) ×2 IMPLANT
YANKAUER SUCT BULB TIP 10FT TU (MISCELLANEOUS) ×2 IMPLANT

## 2020-03-23 NOTE — Transfer of Care (Signed)
Immediate Anesthesia Transfer of Care Note  Patient: Ashley Mcgee  Procedure(s) Performed: TOTAL HIP ARTHROPLASTY ANTERIOR APPROACH (Left Hip)  Patient Location: PACU  Anesthesia Type:General  Level of Consciousness: awake and alert   Airway & Oxygen Therapy: Patient Spontanous Breathing and Patient connected to face mask oxygen  Post-op Assessment: Report given to RN and Post -op Vital signs reviewed and stable  Post vital signs: Reviewed and stable  Last Vitals:  Vitals Value Taken Time  BP 112/62 03/23/20 0945  Temp    Pulse 107 03/23/20 0946  Resp 29 03/23/20 0946  SpO2 90 % 03/23/20 0946  Vitals shown include unvalidated device data.  Last Pain:  Vitals:   03/23/20 0630  TempSrc: Oral  PainSc:       Patients Stated Pain Goal: 5 (03/22/20 1938)  Complications: No apparent anesthesia complications

## 2020-03-23 NOTE — Progress Notes (Signed)
Pt in pacu at time of bedside rounding on floor.

## 2020-03-23 NOTE — H&P (View-Only) (Signed)
Reason for Consult: Left hip fracture  Referring Physician:  Jarvis Newcomer, MD (Hospitalist)  Ashley Mcgee is an 49 y.o. female.  HPI: Ashley Mcgee is a 50 y.o. female with a history of alcohol abuse, poorly-controlled T2DM with diabetic neuropathy, HTN, HLD, tobacco use who presented to the ED from home after a fall last night with left hip pain.   She has limited memory of the events leading to a fall in her bedroom on a concrete floor last night around 1am. She had had 1 beer earlier, thinks she tripped over her shoes falling on the left hip and noticed immediate severe nonradiating sharp pain in the left hip. This caused her to be unable to get up, though finally she called and got help to get to the ED. She has fallen many times over the previous months, feels unsteady on her feet due to neuropathy, and did not pass out or lose consciousness  She does not have any further complaints this am upon evaluation  Past Medical History:  Diagnosis Date  . Alcohol dependence in remission (HCC)   . Diabetic peripheral neuropathy (HCC)   . Hyperlipidemia   . Tobacco dependence   . Type 2 diabetes with complication [facility])     Past Surgical History:  Procedure Laterality Date  . cataract surgery      Family History  Problem Relation Age of Onset  . Diabetes Mother   . Hypertension Mother   . Diabetes Father   . Hypertension Father     Social History:  reports that she has been smoking cigarettes. She has been smoking about 0.50 packs per day. She has never used smokeless tobacco. She reports previous drug use. She reports that she does not drink alcohol.  Allergies: No Known Allergies  Medications:  I have reviewed the patient's current medications. Scheduled: . chlorhexidine  60 mL Topical Once  . divalproex  500 mg Oral BID  . folic acid  1 mg Oral Daily  . gabapentin  100 mg Oral TID  . insulin aspart  0-9 Units Subcutaneous Q4H  . multivitamin with minerals  1 tablet Oral Daily   . povidone-iodine  2 application Topical Once  . povidone-iodine  2 application Topical Once  . senna  1 tablet Oral Daily  . sodium chloride flush  3 mL Intravenous Q12H  . thiamine  100 mg Oral Daily    Results for orders placed or performed during the hospital encounter of 03/22/20 (from the past 24 hour(s))  Comprehensive metabolic panel     Status: Abnormal   Collection Time: 03/22/20  2:19 PM  Result Value Ref Range   Sodium 122 (L) 135 - 145 mmol/L   Potassium 3.8 3.5 - 5.1 mmol/L   Chloride 85 (L) 98 - 111 mmol/L   CO2 25 22 - 32 mmol/L   Glucose, Bld 257 (H) 70 - 99 mg/dL   BUN 9 6 - 20 mg/dL   Creatinine, Ser 8.29 0.44 - 1.00 mg/dL   Calcium 9.3 8.9 - 56.2 mg/dL   Total Protein 8.5 (H) 6.5 - 8.1 g/dL   Albumin 4.6 3.5 - 5.0 g/dL   AST 34 15 - 41 U/L   ALT 30 0 - 44 U/L   Alkaline Phosphatase 79 38 - 126 U/L   Total Bilirubin 0.7 0.3 - 1.2 mg/dL   GFR calc non Af Amer >60 >60 mL/min   GFR calc Af Amer >60 >60 mL/min   Anion gap 12 5 - 15  CBC with Differential     Status: Abnormal   Collection Time: 03/22/20  2:19 PM  Result Value Ref Range   WBC 15.2 (H) 4.0 - 10.5 K/uL   RBC 4.40 3.87 - 5.11 MIL/uL   Hemoglobin 14.6 12.0 - 15.0 g/dL   HCT 51.7 00.1 - 74.9 %   MCV 94.1 80.0 - 100.0 fL   MCH 33.2 26.0 - 34.0 pg   MCHC 35.3 30.0 - 36.0 g/dL   RDW 44.9 (L) 67.5 - 91.6 %   Platelets 305 150 - 400 K/uL   nRBC 0.0 0.0 - 0.2 %   Neutrophils Relative % 86 %   Neutro Abs 13.1 (H) 1.7 - 7.7 K/uL   Lymphocytes Relative 8 %   Lymphs Abs 1.2 0.7 - 4.0 K/uL   Monocytes Relative 4 %   Monocytes Absolute 0.7 0.1 - 1.0 K/uL   Eosinophils Relative 0 %   Eosinophils Absolute 0.0 0.0 - 0.5 K/uL   Basophils Relative 1 %   Basophils Absolute 0.1 0.0 - 0.1 K/uL   Immature Granulocytes 1 %   Abs Immature Granulocytes 0.18 (H) 0.00 - 0.07 K/uL  CK     Status: Abnormal   Collection Time: 03/22/20  2:19 PM  Result Value Ref Range   Total CK 258 (H) 38 - 234 U/L  I-Stat Beta  hCG blood, ED ([facility], [facility], [facility] only)     Status: Abnormal   Collection Time: 03/22/20  3:19 PM  Result Value Ref Range   I-stat hCG, quantitative 10.8 (H) <5 mIU/mL   Comment 3          SARS Coronavirus 2 by RT PCR (hospital order, performed in [facility] hospital lab) Nasopharyngeal Nasopharyngeal Swab     Status: None   Collection Time: 03/22/20  4:52 PM   Specimen: Nasopharyngeal Swab  Result Value Ref Range   SARS Coronavirus 2 NEGATIVE NEGATIVE  Urine rapid drug screen (hosp performed)     Status: Abnormal   Collection Time: 03/22/20  5:19 PM  Result Value Ref Range   Opiates NONE DETECTED NONE DETECTED   Cocaine NONE DETECTED NONE DETECTED   Benzodiazepines POSITIVE (A) NONE DETECTED   Amphetamines NONE DETECTED NONE DETECTED   Tetrahydrocannabinol NONE DETECTED NONE DETECTED   Barbiturates NONE DETECTED NONE DETECTED  Ethanol     Status: Abnormal   Collection Time: 03/22/20  7:00 PM  Result Value Ref Range   Alcohol, Ethyl (B) 21 (H) <10 mg/dL  Glucose, capillary     Status: Abnormal   Collection Time: 03/22/20  7:45 PM  Result Value Ref Range   Glucose-Capillary 189 (H) 70 - 99 mg/dL  Glucose, capillary     Status: Abnormal   Collection Time: 03/23/20 12:21 AM  Result Value Ref Range   Glucose-Capillary 242 (H) 70 - 99 mg/dL   Comment 1 Notify RN   MRSA PCR Screening     Status: None   Collection Time: 03/23/20 12:50 AM   Specimen: Nasal Mucosa; Nasopharyngeal  Result Value Ref Range   MRSA by PCR NEGATIVE NEGATIVE  Glucose, capillary     Status: Abnormal   Collection Time: 03/23/20  4:32 AM  Result Value Ref Range   Glucose-Capillary 211 (H) 70 - 99 mg/dL  Basic metabolic panel     Status: Abnormal   Collection Time: 03/23/20  4:45 AM  Result Value Ref Range   Sodium 126 (L) 135 - 145 mmol/L   Potassium 3.7 3.5 - 5.1  mmol/L   Chloride 92 (L) 98 - 111 mmol/L   CO2 25 22 - 32 mmol/L   Glucose, Bld 224 (H) 70 - 99 mg/dL   BUN 8 6 - 20 mg/dL   Creatinine, Ser 1.06  0.44 - 1.00 mg/dL   Calcium 8.2 (L) 8.9 - 10.3 mg/dL   GFR calc non Af Amer >60 >60 mL/min   GFR calc Af Amer >60 >60 mL/min   Anion gap 9 5 - 15  CBC     Status: Abnormal   Collection Time: 03/23/20  4:45 AM  Result Value Ref Range   WBC 13.0 (H) 4.0 - 10.5 K/uL   RBC 3.85 (L) 3.87 - 5.11 MIL/uL   Hemoglobin 12.6 12.0 - 15.0 g/dL   HCT 26.9 48.5 - 46.2 %   MCV 95.8 80.0 - 100.0 fL   MCH 32.7 26.0 - 34.0 pg   MCHC 34.1 30.0 - 36.0 g/dL   RDW 70.3 50.0 - 93.8 %   Platelets 237 150 - 400 K/uL   nRBC 0.0 0.0 - 0.2 %  Protime-INR     Status: None   Collection Time: 03/23/20  4:45 AM  Result Value Ref Range   Prothrombin Time 13.8 11.4 - 15.2 seconds   INR 1.1 0.8 - 1.2    X-ray: CLINICAL DATA:  Hip pain.  EXAM: DG HIP (WITH OR WITHOUT PELVIS) 2-3V LEFT  COMPARISON:  None.  FINDINGS: There is an acute displaced subcapital fracture of the proximal left femur. There is no dislocation. The fracture plane appears irregular. There are degenerative changes of both hips.  IMPRESSION: Acute displaced subcapital fracture of the proximal left femur. The fracture plane appears irregular which may be secondary to impaction. However, an underlying lytic lesion should be considered and can be further evaluated with cross-sectional imaging as clinically indicated.   Electronically Signed   By: Katherine Mantle M.D.  CLINICAL DATA:  Fracture.  EXAM: CT PELVIS WITHOUT CONTRAST  TECHNIQUE: Multidetector CT imaging of the pelvis was performed following the standard protocol without intravenous contrast.  COMPARISON:  X-ray from same day there be stone the portion urinary. There is a probable stone in the dependent portion of the urinary bladder.  FINDINGS: Urinary Tract: There is a stone in the dependent portion of the urinary bladder.  Bowel:  Unremarkable visualized pelvic bowel loops.  Vascular/Lymphatic: There are advanced vascular  calcifications visualized arterial structures.  Reproductive:  No mass or other significant abnormality  Other:  None.  Musculoskeletal: Again noted is an acute displaced impacted subcapital/transcervical fracture of the proximal left femur. The appearance of the fracture is favored to be secondary to impaction as opposed to with underlying soft tissue lytic lesion. There are no lytic lesions identified in the remaining osseous structures.  IMPRESSION: 1. Acute impacted fracture of the proximal left femur as detailed above. No convincing evidence for an underlying lytic soft tissue lesion. 2. Probable small bladder stone. 3.  Aortic Atherosclerosis (ICD10-I70.0).   Electronically Signed   By: Katherine Mantle M.D.  ROS: Per HPI Denies fever, chills, weight loss, changes in vision or hearing, headache, cough, sore throat, chest pain, palpitations, shortness of breath, abdominal pain, nausea, vomiting, changes in bowel habits, blood in stool, change in bladder habits, she stays thirsty, No myalgias, arthralgias (prior to fall), and rash, and per HPI. All others reviewed and are negative.   Blood pressure 140/70, pulse (!) 109, temperature 99.2 F (37.3 C), temperature source Oral, resp. rate 20,  height 5\' 10"  (1.778 m), weight 96.4 kg, SpO2 93 %.  Physical Exam: : 49 y.o. female in no distress, calm demeanor Eyes: Lids and conjunctivae normal, PERRL ENMT: Mucous membranes are moist. Posterior pharynx clear of any exudate or lesions. Poor dentition.  Neck: normal, supple, no masses, no thyromegaly Respiratory: Non-labored breathing room air without accessory muscle use. Clear breath sounds to auscultation bilaterally Cardiovascular: Regular rate and rhythm, no murmurs, rubs, or gallops. No carotid bruits. No JVD. No LE edema. Palpable pedal pulses. Abdomen: Normoactive bowel sounds. No tenderness, non-distended, and no masses palpated. No hepatosplenomegaly. GU: No  indwelling catheter Musculoskeletal: No clubbing / cyanosis. LLE externally rotated and shortened. No other joint deformity upper and lower extremities. Normal muscle tone.  Skin: Warm, dry. No rashes, wounds, or ulcers. No significant lesions noted.  Neurologic: CN II-XII grossly intact. Decreased sensation in stocking distribution on feet symmetrically, warm feet, motor function preserved distally. No focal deficits in motor strength or sensation in all extremities.  Psychiatric: Alert and oriented x3. Normal judgment and insight. Mood anxious with broad affect.    Assessment/Plan: 1. Displaced left femoral neck fracture  Plan: To OR today  Reviewed options to treat and feel that total hip replacement is best current short and long term management of her acute left hip fracture Consent obtained Post op course potentially complicated by her diabetes and compliance with history of alcohol dependence Likely WBAT lle post Aspirin for DVT for 4 weeks Post op PO antibiotics for 7 days due to poorly controlled diabetes  Mauri Pole 03/23/2020, 6:53 AM

## 2020-03-23 NOTE — Plan of Care (Signed)
Plan of care reviewed and discussed with the patient. 

## 2020-03-23 NOTE — Consult Note (Signed)
Reason for Consult: Left hip fracture  Referring Physician:  Grunz, MD (Hospitalist)  Ashley Mcgee is an 49 y.o. female.  HPI: Ashley Mcgee is a 49 y.o. female with a history of alcohol abuse, poorly-controlled T2DM with diabetic neuropathy, HTN, HLD, tobacco use who presented to the ED from home after a fall last night with left hip pain.   She has limited memory of the events leading to a fall in her bedroom on a concrete floor last night around 1am. She had had 1 beer earlier, thinks she tripped over her shoes falling on the left hip and noticed immediate severe nonradiating sharp pain in the left hip. This caused her to be unable to get up, though finally she called and got help to get to the ED. She has fallen many times over the previous months, feels unsteady on her feet due to neuropathy, and did not pass out or lose consciousness  She does not have any further complaints this am upon evaluation  Past Medical History:  Diagnosis Date  . Alcohol dependence in remission (HCC)   . Diabetic peripheral neuropathy (HCC)   . Hyperlipidemia   . Tobacco dependence   . Type 2 diabetes with complication (HCC)     Past Surgical History:  Procedure Laterality Date  . cataract surgery      Family History  Problem Relation Age of Onset  . Diabetes Mother   . Hypertension Mother   . Diabetes Father   . Hypertension Father     Social History:  reports that she has been smoking cigarettes. She has been smoking about 0.50 packs per day. She has never used smokeless tobacco. She reports previous drug use. She reports that she does not drink alcohol.  Allergies: No Known Allergies  Medications:  I have reviewed the patient's current medications. Scheduled: . chlorhexidine  60 mL Topical Once  . divalproex  500 mg Oral BID  . folic acid  1 mg Oral Daily  . gabapentin  100 mg Oral TID  . insulin aspart  0-9 Units Subcutaneous Q4H  . multivitamin with minerals  1 tablet Oral Daily   . povidone-iodine  2 application Topical Once  . povidone-iodine  2 application Topical Once  . senna  1 tablet Oral Daily  . sodium chloride flush  3 mL Intravenous Q12H  . thiamine  100 mg Oral Daily    Results for orders placed or performed during the hospital encounter of 03/22/20 (from the past 24 hour(s))  Comprehensive metabolic panel     Status: Abnormal   Collection Time: 03/22/20  2:19 PM  Result Value Ref Range   Sodium 122 (L) 135 - 145 mmol/L   Potassium 3.8 3.5 - 5.1 mmol/L   Chloride 85 (L) 98 - 111 mmol/L   CO2 25 22 - 32 mmol/L   Glucose, Bld 257 (H) 70 - 99 mg/dL   BUN 9 6 - 20 mg/dL   Creatinine, Ser 0.52 0.44 - 1.00 mg/dL   Calcium 9.3 8.9 - 10.3 mg/dL   Total Protein 8.5 (H) 6.5 - 8.1 g/dL   Albumin 4.6 3.5 - 5.0 g/dL   AST 34 15 - 41 U/L   ALT 30 0 - 44 U/L   Alkaline Phosphatase 79 38 - 126 U/L   Total Bilirubin 0.7 0.3 - 1.2 mg/dL   GFR calc non Af Amer >60 >60 mL/min   GFR calc Af Amer >60 >60 mL/min   Anion gap 12 5 - 15    CBC with Differential     Status: Abnormal   Collection Time: 03/22/20  2:19 PM  Result Value Ref Range   WBC 15.2 (H) 4.0 - 10.5 K/uL   RBC 4.40 3.87 - 5.11 MIL/uL   Hemoglobin 14.6 12.0 - 15.0 g/dL   HCT 51.7 00.1 - 74.9 %   MCV 94.1 80.0 - 100.0 fL   MCH 33.2 26.0 - 34.0 pg   MCHC 35.3 30.0 - 36.0 g/dL   RDW 44.9 (L) 67.5 - 91.6 %   Platelets 305 150 - 400 K/uL   nRBC 0.0 0.0 - 0.2 %   Neutrophils Relative % 86 %   Neutro Abs 13.1 (H) 1.7 - 7.7 K/uL   Lymphocytes Relative 8 %   Lymphs Abs 1.2 0.7 - 4.0 K/uL   Monocytes Relative 4 %   Monocytes Absolute 0.7 0.1 - 1.0 K/uL   Eosinophils Relative 0 %   Eosinophils Absolute 0.0 0.0 - 0.5 K/uL   Basophils Relative 1 %   Basophils Absolute 0.1 0.0 - 0.1 K/uL   Immature Granulocytes 1 %   Abs Immature Granulocytes 0.18 (H) 0.00 - 0.07 K/uL  CK     Status: Abnormal   Collection Time: 03/22/20  2:19 PM  Result Value Ref Range   Total CK 258 (H) 38 - 234 U/L  I-Stat Beta  hCG blood, ED ([facility], [facility], [facility] only)     Status: Abnormal   Collection Time: 03/22/20  3:19 PM  Result Value Ref Range   I-stat hCG, quantitative 10.8 (H) <5 mIU/mL   Comment 3          SARS Coronavirus 2 by RT PCR (hospital order, performed in [facility] hospital lab) Nasopharyngeal Nasopharyngeal Swab     Status: None   Collection Time: 03/22/20  4:52 PM   Specimen: Nasopharyngeal Swab  Result Value Ref Range   SARS Coronavirus 2 NEGATIVE NEGATIVE  Urine rapid drug screen (hosp performed)     Status: Abnormal   Collection Time: 03/22/20  5:19 PM  Result Value Ref Range   Opiates NONE DETECTED NONE DETECTED   Cocaine NONE DETECTED NONE DETECTED   Benzodiazepines POSITIVE (A) NONE DETECTED   Amphetamines NONE DETECTED NONE DETECTED   Tetrahydrocannabinol NONE DETECTED NONE DETECTED   Barbiturates NONE DETECTED NONE DETECTED  Ethanol     Status: Abnormal   Collection Time: 03/22/20  7:00 PM  Result Value Ref Range   Alcohol, Ethyl (B) 21 (H) <10 mg/dL  Glucose, capillary     Status: Abnormal   Collection Time: 03/22/20  7:45 PM  Result Value Ref Range   Glucose-Capillary 189 (H) 70 - 99 mg/dL  Glucose, capillary     Status: Abnormal   Collection Time: 03/23/20 12:21 AM  Result Value Ref Range   Glucose-Capillary 242 (H) 70 - 99 mg/dL   Comment 1 Notify RN   MRSA PCR Screening     Status: None   Collection Time: 03/23/20 12:50 AM   Specimen: Nasal Mucosa; Nasopharyngeal  Result Value Ref Range   MRSA by PCR NEGATIVE NEGATIVE  Glucose, capillary     Status: Abnormal   Collection Time: 03/23/20  4:32 AM  Result Value Ref Range   Glucose-Capillary 211 (H) 70 - 99 mg/dL  Basic metabolic panel     Status: Abnormal   Collection Time: 03/23/20  4:45 AM  Result Value Ref Range   Sodium 126 (L) 135 - 145 mmol/L   Potassium 3.7 3.5 - 5.1  mmol/L   Chloride 92 (L) 98 - 111 mmol/L   CO2 25 22 - 32 mmol/L   Glucose, Bld 224 (H) 70 - 99 mg/dL   BUN 8 6 - 20 mg/dL   Creatinine, Ser 1.06  0.44 - 1.00 mg/dL   Calcium 8.2 (L) 8.9 - 10.3 mg/dL   GFR calc non Af Amer >60 >60 mL/min   GFR calc Af Amer >60 >60 mL/min   Anion gap 9 5 - 15  CBC     Status: Abnormal   Collection Time: 03/23/20  4:45 AM  Result Value Ref Range   WBC 13.0 (H) 4.0 - 10.5 K/uL   RBC 3.85 (L) 3.87 - 5.11 MIL/uL   Hemoglobin 12.6 12.0 - 15.0 g/dL   HCT 26.9 48.5 - 46.2 %   MCV 95.8 80.0 - 100.0 fL   MCH 32.7 26.0 - 34.0 pg   MCHC 34.1 30.0 - 36.0 g/dL   RDW 70.3 50.0 - 93.8 %   Platelets 237 150 - 400 K/uL   nRBC 0.0 0.0 - 0.2 %  Protime-INR     Status: None   Collection Time: 03/23/20  4:45 AM  Result Value Ref Range   Prothrombin Time 13.8 11.4 - 15.2 seconds   INR 1.1 0.8 - 1.2    X-ray: CLINICAL DATA:  Hip pain.  EXAM: DG HIP (WITH OR WITHOUT PELVIS) 2-3V LEFT  COMPARISON:  None.  FINDINGS: There is an acute displaced subcapital fracture of the proximal left femur. There is no dislocation. The fracture plane appears irregular. There are degenerative changes of both hips.  IMPRESSION: Acute displaced subcapital fracture of the proximal left femur. The fracture plane appears irregular which may be secondary to impaction. However, an underlying lytic lesion should be considered and can be further evaluated with cross-sectional imaging as clinically indicated.   Electronically Signed   By: Katherine Mantle M.D.  CLINICAL DATA:  Fracture.  EXAM: CT PELVIS WITHOUT CONTRAST  TECHNIQUE: Multidetector CT imaging of the pelvis was performed following the standard protocol without intravenous contrast.  COMPARISON:  X-ray from same day there be stone the portion urinary. There is a probable stone in the dependent portion of the urinary bladder.  FINDINGS: Urinary Tract: There is a stone in the dependent portion of the urinary bladder.  Bowel:  Unremarkable visualized pelvic bowel loops.  Vascular/Lymphatic: There are advanced vascular  calcifications visualized arterial structures.  Reproductive:  No mass or other significant abnormality  Other:  None.  Musculoskeletal: Again noted is an acute displaced impacted subcapital/transcervical fracture of the proximal left femur. The appearance of the fracture is favored to be secondary to impaction as opposed to with underlying soft tissue lytic lesion. There are no lytic lesions identified in the remaining osseous structures.  IMPRESSION: 1. Acute impacted fracture of the proximal left femur as detailed above. No convincing evidence for an underlying lytic soft tissue lesion. 2. Probable small bladder stone. 3.  Aortic Atherosclerosis (ICD10-I70.0).   Electronically Signed   By: Katherine Mantle M.D.  ROS: Per HPI Denies fever, chills, weight loss, changes in vision or hearing, headache, cough, sore throat, chest pain, palpitations, shortness of breath, abdominal pain, nausea, vomiting, changes in bowel habits, blood in stool, change in bladder habits, she stays thirsty, No myalgias, arthralgias (prior to fall), and rash, and per HPI. All others reviewed and are negative.   Blood pressure 140/70, pulse (!) 109, temperature 99.2 F (37.3 C), temperature source Oral, resp. rate 20,  height 5\' 10"  (1.778 m), weight 96.4 kg, SpO2 93 %.  Physical Exam: : 49 y.o. female in no distress, calm demeanor Eyes: Lids and conjunctivae normal, PERRL ENMT: Mucous membranes are moist. Posterior pharynx clear of any exudate or lesions. Poor dentition.  Neck: normal, supple, no masses, no thyromegaly Respiratory: Non-labored breathing room air without accessory muscle use. Clear breath sounds to auscultation bilaterally Cardiovascular: Regular rate and rhythm, no murmurs, rubs, or gallops. No carotid bruits. No JVD. No LE edema. Palpable pedal pulses. Abdomen: Normoactive bowel sounds. No tenderness, non-distended, and no masses palpated. No hepatosplenomegaly. GU: No  indwelling catheter Musculoskeletal: No clubbing / cyanosis. LLE externally rotated and shortened. No other joint deformity upper and lower extremities. Normal muscle tone.  Skin: Warm, dry. No rashes, wounds, or ulcers. No significant lesions noted.  Neurologic: CN II-XII grossly intact. Decreased sensation in stocking distribution on feet symmetrically, warm feet, motor function preserved distally. No focal deficits in motor strength or sensation in all extremities.  Psychiatric: Alert and oriented x3. Normal judgment and insight. Mood anxious with broad affect.    Assessment/Plan: 1. Displaced left femoral neck fracture  Plan: To OR today  Reviewed options to treat and feel that total hip replacement is best current short and long term management of her acute left hip fracture Consent obtained Post op course potentially complicated by her diabetes and compliance with history of alcohol dependence Likely WBAT lle post Aspirin for DVT for 4 weeks Post op PO antibiotics for 7 days due to poorly controlled diabetes  Mauri Pole 03/23/2020, 6:53 AM

## 2020-03-23 NOTE — Anesthesia Procedure Notes (Signed)
Procedure Name: Intubation Date/Time: 03/23/2020 7:43 AM Performed by: Minerva Ends, CRNA Pre-anesthesia Checklist: Patient identified, Emergency Drugs available, Suction available and Patient being monitored Patient Re-evaluated:Patient Re-evaluated prior to induction Oxygen Delivery Method: Circle System Utilized Preoxygenation: Pre-oxygenation with 100% oxygen Induction Type: IV induction Ventilation: Mask ventilation without difficulty Laryngoscope Size: Miller and 2 Grade View: Grade II Tube type: Oral Number of attempts: 1 Airway Equipment and Method: Stylet Placement Confirmation: ETT inserted through vocal cords under direct vision,  positive ETCO2 and breath sounds checked- equal and bilateral Secured at: 23 cm Tube secured with: Tape Dental Injury: Teeth and Oropharynx as per pre-operative assessment  Difficulty Due To: Difficult Airway- due to dentition and Difficulty was anticipated Future Recommendations: Recommend- induction with short-acting agent, and alternative techniques readily available Comments: DIFFICULT INTUBATION DUE TO TEETH- ANTERIOR -- smooth IV induction Witman-- intubation AM CRNA atruamtic-- teeth and mouth as preop-- pt reported many missing chipped broken teeth-- unchanged with laryngoscopy-- bilat BS Witman

## 2020-03-23 NOTE — Op Note (Signed)
NAME:  Ashley Mcgee                ACCOUNT NO.: [id number]      MEDICAL RECORD NO.: [id number]      FACILITY:  [facility]      PHYSICIAN:  Shelda Pal  DATE OF BIRTH:  [birth year]     DATE OF PROCEDURE:  03/23/2020                                 OPERATIVE REPORT         PREOPERATIVE DIAGNOSIS: Left displaced femoral neck fracture      POSTOPERATIVE DIAGNOSIS:  Left displaced femoral neck fracture      PROCEDURE:  Left total hip replacement through an anterior approach   utilizing DePuy THR system, component size 52 mm pinnacle cup, a size 36+4 neutral   Altrex liner, a size 6 Hi Actis stem with a 36+1.5 delta ceramic   ball.      SURGEON:  Madlyn Frankel. Charlann Boxer, M.D.      ASSISTANT:  Lambert Mody, PA-C     ANESTHESIA:  General.      SPECIMENS:  None.      COMPLICATIONS:  None.      BLOOD LOSS:  300 cc     DRAINS:  None.      INDICATION OF THE PROCEDURE:  Ashley Mcgee is a 49 y.o. female who had   presented to office for evaluation of left hip pain after ground level fall at home. She was brought to the ER by EMS.   Radiographs revealed displaced left femoral neck fracture.  She was admitted to hopsitalist service over night due to her poorly controlled diabetes.   She was seen and evaluated with current recommendations to perform left to total hip replacement to best manage her femoral neck fracture.  Consent was obtained for   benefit of pain relief.  Specific risks of infection, DVT, component   failure, dislocation, neurovascular injury, and need for revision surgery were reviewed.     PROCEDURE IN DETAIL:  The patient was brought to operative theater.   Once adequate anesthesia, preoperative antibiotics, 2 gm of Ancef, 1 gm of Tranexamic Acid, and 10 mg of Decadron were administered, the patient was positioned supine on the [employer] table.  Once the patient was safely positioned with adequate padding of boney prominences we predraped out the  hip, and used fluoroscopy to confirm orientation of the pelvis.      The left hip was then prepped and draped from proximal iliac crest to   mid thigh with a shower curtain technique.      Time-out was performed identifying the patient, planned procedure, and the appropriate extremity.     An incision was then made 2 cm lateral to the   anterior superior iliac spine extending over the orientation of the   tensor fascia lata muscle and sharp dissection was carried down to the   fascia of the muscle.      The fascia was then incised.  The muscle belly was identified and swept   laterally and retractor placed along the superior neck.  Following   cauterization of the circumflex vessels and removing some pericapsular   fat, a second cobra retractor was placed on the inferior neck.  A T-capsulotomy was made along the line of the   superior neck to the trochanteric  fossa, then extended proximally and   distally.  Tag sutures were placed and the retractors were then placed   intracapsular.  We then identified the femoral neck fracture.  A neck osteotomy with the femur on traction from the trochanteric fossa to the medial neck.  The femoral   head and fractured femoral neck bone segment was removed without difficulty or complication.  Traction was let   off and retractors were placed posterior and anterior around the   acetabulum.      The labrum and foveal tissue were debrided.  I began reaming with a 45 mm   reamer and reamed up to 51 mm reamer with good bony bed preparation and a 52 mm  cup was chosen.  The final 52 mm Pinnacle cup was then impacted under fluoroscopy to confirm the depth of penetration and orientation with respect to   Abduction and forward flexion.  A screw was placed into the ilium followed by the hole eliminator.  The final   36+4 neutral Altrex liner was impacted with good visualized rim fit.  The cup was positioned anatomically within the acetabular portion of the pelvis.       At this point, the femur was rolled to 100 degrees.  Further capsule was   released off the inferior aspect of the femoral neck.  I then   released the superior capsule proximally.  With the leg in a neutral position the hook was placed laterally   along the femur under the vastus lateralis origin and elevated manually and then held in position using the hook attachment on the bed.  The leg was then extended and adducted with the leg rolled to 100   degrees of external rotation.  Retractors were placed along the medial calcar and posteriorly over the greater trochanter.  Once the proximal femur was fully   exposed, I used a box osteotome to set orientation.  I then began   broaching with the starting chili pepper broach and passed this by hand and then broached up to 6.  With the 6 broach in place I chose a high offset neck and did several trial reductions.  The offset was appropriate, leg lengths   appeared to be equal best matched with the +1.5 head ball trial confirmed radiographically.   Given these findings, I went ahead and dislocated the hip, repositioned all   retractors and positioned the right hip in the extended and abducted position.  The final 6 Hi Actis stem was   chosen and it was impacted down to the level of neck cut.  Based on this   and the trial reductions, a final 36+1.5 delta ceramic ball was chosen and   impacted onto a clean and dry trunnion, and the hip was reduced.  The   hip had been irrigated throughout the case again at this point.  I did   reapproximate the superior capsular leaflet to the anterior leaflet   using #1 Vicryl.  The fascia of the   tensor fascia lata muscle was then reapproximated using #1 Vicryl and #0 Stratafix sutures.  The   remaining wound was closed with 2-0 Vicryl and running 4-0 Monocryl.   The hip was cleaned, dried, and dressed sterilely using Dermabond and   Aquacel dressing.  The patient was then brought   to recovery room in stable  condition tolerating the procedure well.    Gertie Fey, PA-C was present for the entirety of the case involved from  preoperative positioning, perioperative retractor management, general   facilitation of the case, as well as primary wound closure as assistant.            Madlyn Frankel Charlann Boxer, M.D.        03/23/2020 7:08 AM

## 2020-03-23 NOTE — Anesthesia Preprocedure Evaluation (Addendum)
Anesthesia Evaluation  Patient identified by MRN, date of birth, ID band Patient awake    Reviewed: Allergy & Precautions, NPO status , Patient's Chart, lab work & pertinent test results  History of Anesthesia Complications Negative for: history of anesthetic complications  Airway Mallampati: III  TM Distance: >3 FB Neck ROM: Full    Dental  (+) Poor Dentition   Pulmonary Current Smoker,    Pulmonary exam normal        Cardiovascular negative cardio ROS Normal cardiovascular exam     Neuro/Psych negative neurological ROS  negative psych ROS   GI/Hepatic negative GI ROS, (+)     substance abuse (in remission)  alcohol use,   Endo/Other  diabetes, Poorly Controlled  Renal/GU negative Renal ROS  negative genitourinary   Musculoskeletal negative musculoskeletal ROS (+)   Abdominal   Peds  Hematology negative hematology ROS (+)   Anesthesia Other Findings  Hyponatremia - 122 on admission, improved to 129 this AM  Reproductive/Obstetrics                             Anesthesia Physical Anesthesia Plan  ASA: III and emergent  Anesthesia Plan: General   Post-op Pain Management:    Induction: Intravenous  PONV Risk Score and Plan: 2 and Ondansetron, Dexamethasone, Treatment may vary due to age or medical condition and Midazolam  Airway Management Planned: Oral ETT  Additional Equipment: None  Intra-op Plan:   Post-operative Plan: Extubation in OR  Informed Consent: I have reviewed the patients History and Physical, chart, labs and discussed the procedure including the risks, benefits and alternatives for the proposed anesthesia with the patient or authorized representative who has indicated his/her understanding and acceptance.     Dental advisory given  Plan Discussed with:   Anesthesia Plan Comments:        Anesthesia Quick Evaluation

## 2020-03-23 NOTE — Plan of Care (Signed)
  Problem: Clinical Measurements: Goal: Respiratory complications will improve Outcome: Progressing   Problem: Clinical Measurements: Goal: Cardiovascular complication will be avoided Outcome: Progressing   Problem: Nutrition: Goal: Adequate nutrition will be maintained Outcome: Progressing   Problem: Coping: Goal: Level of anxiety will decrease Outcome: Progressing   Problem: Pain Managment: Goal: General experience of comfort will improve Outcome: Progressing   Problem: Skin Integrity: Goal: Risk for impaired skin integrity will decrease Outcome: Progressing   

## 2020-03-23 NOTE — Anesthesia Procedure Notes (Signed)
Date/Time: 03/23/2020 9:35 AM Performed by: Minerva Ends, CRNA Oxygen Delivery Method: Simple face mask Placement Confirmation: positive ETCO2 and breath sounds checked- equal and bilateral Dental Injury: Teeth and Oropharynx as per pre-operative assessment

## 2020-03-23 NOTE — Progress Notes (Signed)
PROGRESS NOTE  Ashley Mcgee  BPZ:[id number] DOB: 01/17/1971 DOA: 03/22/2020 PCP: Cain Saupe, MD   Brief Narrative: Ashley Mcgee is a 49 y.o. female with a history of alcohol abuse, poorly-controlled T2DM with diabetic neuropathy, HTN, HLD, and tobacco use who presented to the ED 5/29 after a fall at home the previous evening found to have displaced left hip fracture. She was admitted and taken for THA on 5/30.   Assessment & Plan: Principal Problem:   Closed left hip fracture (HCC) Active Problems:   Alcohol dependence in remission (HCC)   Hyperlipidemia   Diabetic peripheral neuropathy (HCC)   Tobacco dependence   Type 2 diabetes with complication (HCC)   Hyponatremia  Left hip fracture due to fall at home: s/p anterior THA by Dr. Charlann Boxer 5/30. Note CT showed no evidence of underlying lytic lesion.  - PT/OT postop, weight bearing at that time per orthopedics.  - DVT ppx per ortho - Fall precautions.  T2DM with diabetic neuropathy uncontrolled with hyperglycemia:  - Augment SSI, change to ACHS.  - Restarted gabapentin at lower dose as she's not been taking this regularly.  - Hold glipizide, metformin, states she's not taking lantus as it is on her MAR.   Hyponatremia: Exaggerated by hyperglycemia, improving with isotonic saline at an acceptable rate. Likely due to dehydration and alcohol intake.  - Continue isotonic saline x24 hrs. - Monitor BMP.   HLD:  - Pt not taking statin  HTN:  - Pt not taking lisinopril  Tobacco use: ~1ppd - Nicotine patch prn  History of alcohol abuse:  - EtOH level was not checked at arrival, but at 7pm on the day of admission it was still detectable, so suspect intoxication likely played a roll in fall - Monitor clinically, though not in withdrawal at this time.  - Empiric vitamins as ordered.   DVT prophylaxis: Aspirin 81mg  BID Code Status: Full Family Communication: None Disposition Plan:  Status is: Inpatient  Remains  inpatient appropriate because:Ongoing active pain requiring inpatient pain management and Unsafe d/c plan   Dispo: The patient is from: Home              Anticipated d/c is to: TBD based on PT/OT and postoperative course              Anticipated d/c date is: 2 days              Patient currently is not medically stable to d/c.  Consultants:   Orthopedics  Procedures:   Left THA  Antimicrobials:  Ancef perioperatively   Subjective: Pain in left hip postoperatively but improved from yesterday. No nausea/vomiting, hungry and wants to eat. No anxiety/tremors.   Objective: Vitals:   03/23/20 1100 03/23/20 1151 03/23/20 1249 03/23/20 1356  BP: 127/74 (!) 145/67 (!) 141/71 134/70  Pulse: 97 91 94 89  Resp: (!) 27 20 18 20   Temp: 98.8 F (37.1 C) 98.4 F (36.9 C) 98.2 F (36.8 C) 98.4 F (36.9 C)  TempSrc:  Oral Oral Oral  SpO2: 100% 96% 96%   Weight:      Height:        Intake/Output Summary (Last 24 hours) at 03/23/2020 1802 Last data filed at 03/23/2020 1539 Gross per 24 hour  Intake 4084.77 ml  Output 2425 ml  Net 1659.77 ml   Filed Weights   03/22/20 2255  Weight: 96.4 kg    Gen: 49 y.o. female in no distress  Pulm: Non-labored breathing room air. Clear to  auscultation bilaterally.  CV: Regular rate and rhythm. No murmur, rub, or gallop. No JVD, no pedal edema. GI: Abdomen soft, non-tender, non-distended, with normoactive bowel sounds. No organomegaly or masses felt. Ext: Warm, left leg with normal alignment now Skin: No rashes, lesions or ulcers on visualized skin. Neuro: Alert and oriented. No focal neurological deficits. Psych: Judgement and insight appear normal. Mood & affect appropriate.   Data Reviewed: I have personally reviewed following labs and imaging studies  CBC: Recent Labs  Lab 03/22/20 1419 03/23/20 0445 03/23/20 0728  WBC 15.2* 13.0*  --   NEUTROABS 13.1*  --   --   HGB 14.6 12.6 11.2*  HCT 41.4 36.9 33.0*  MCV 94.1 95.8  --   PLT  305 237  --    Basic Metabolic Panel: Recent Labs  Lab 03/22/20 1419 03/23/20 0445 03/23/20 0728  NA 122* 126* 129*  K 3.8 3.7 3.7  CL 85* 92* 91*  CO2 25 25  --   GLUCOSE 257* 224* 209*  BUN 9 8 6   CREATININE 0.52 0.79 0.70  CALCIUM 9.3 8.2*  --    GFR: Estimated Creatinine Clearance: 104.7 mL/min (by C-G formula based on SCr of 0.7 mg/dL). Liver Function Tests: Recent Labs  Lab 03/22/20 1419  AST 34  ALT 30  ALKPHOS 79  BILITOT 0.7  PROT 8.5*  ALBUMIN 4.6   No results for input(s): LIPASE, AMYLASE in the last 168 hours. No results for input(s): AMMONIA in the last 168 hours. Coagulation Profile: Recent Labs  Lab 03/23/20 0445  INR 1.1   Cardiac Enzymes: Recent Labs  Lab 03/22/20 1419  CKTOTAL 258*   BNP (last 3 results) No results for input(s): PROBNP in the last 8760 hours. HbA1C: No results for input(s): HGBA1C in the last 72 hours. CBG: Recent Labs  Lab 03/23/20 0432 03/23/20 0944 03/23/20 1059 03/23/20 1138 03/23/20 1553  GLUCAP 211* 239* 256* 252* 252*   Lipid Profile: No results for input(s): CHOL, HDL, LDLCALC, TRIG, CHOLHDL, LDLDIRECT in the last 72 hours. Thyroid Function Tests: No results for input(s): TSH, T4TOTAL, FREET4, T3FREE, THYROIDAB in the last 72 hours. Anemia Panel: No results for input(s): VITAMINB12, FOLATE, FERRITIN, TIBC, IRON, RETICCTPCT in the last 72 hours. Urine analysis:    Component Value Date/Time   COLORURINE Yellow 03/21/2014 1209   APPEARANCEUR Clear 03/21/2014 1209   LABSPEC 1.015 03/21/2014 1209   PHURINE 5.0 03/21/2014 1209   GLUCOSEU >=500 03/21/2014 1209   HGBUR Negative 03/21/2014 1209   BILIRUBINUR negative 12/25/2018 1413   BILIRUBINUR Negative 03/21/2014 1209   KETONESUR negative 12/25/2018 1413   KETONESUR Negative 03/21/2014 1209   PROTEINUR Negative 03/21/2014 1209   UROBILINOGEN 0.2 12/25/2018 1413   NITRITE Negative 12/25/2018 1413   NITRITE Negative 03/21/2014 1209   LEUKOCYTESUR  Negative 12/25/2018 1413   LEUKOCYTESUR Negative 03/21/2014 1209   Recent Results (from the past 240 hour(s))  SARS Coronavirus 2 by RT PCR (hospital order, performed in [facility] hospital lab) Nasopharyngeal Nasopharyngeal Swab     Status: None   Collection Time: 03/22/20  4:52 PM   Specimen: Nasopharyngeal Swab  Result Value Ref Range Status   SARS Coronavirus 2 NEGATIVE NEGATIVE Final    Comment: (NOTE) SARS-CoV-2 target nucleic acids are NOT DETECTED. The SARS-CoV-2 RNA is generally detectable in upper and lower respiratory specimens during the acute phase of infection. The lowest concentration of SARS-CoV-2 viral copies this assay can detect is 250 copies / mL. A negative result does  not preclude SARS-CoV-2 infection and should not be used as the sole basis for treatment or other patient management decisions.  A negative result may occur with improper specimen collection / handling, submission of specimen other than nasopharyngeal swab, presence of viral mutation(s) within the areas targeted by this assay, and inadequate number of viral copies (<250 copies / mL). A negative result must be combined with clinical observations, patient history, and epidemiological information. Fact Sheet for Patients:   BoilerBrush.com.cy Fact Sheet for Healthcare Providers: https://pope.com/ This test is not yet approved or cleared  by the [place] FDA and has been authorized for detection and/or diagnosis of SARS-CoV-2 by FDA under an Emergency Use Authorization (EUA).  This EUA will remain in effect (meaning this test can be used) for the duration of the COVID-19 declaration under Section 564(b)(1) of the Act, 21 U.S.C. section 360bbb-3(b)(1), unless the authorization is terminated or revoked sooner. Performed at [facility], [address] [place], [place] [address]   MRSA PCR Screening     Status: None   Collection  Time: 03/23/20 12:50 AM   Specimen: Nasal Mucosa; Nasopharyngeal  Result Value Ref Range Status   MRSA by PCR NEGATIVE NEGATIVE Final    Comment:        The GeneXpert MRSA Assay (FDA approved for NASAL specimens only), is one component of a comprehensive MRSA colonization surveillance program. It is not intended to diagnose MRSA infection nor to guide or monitor treatment for MRSA infections. Performed at [facility], [address] [place], [place] [address]       Radiology Studies: CT PELVIS WO CONTRAST  Result Date: 03/22/2020 CLINICAL DATA:  Fracture. EXAM: CT PELVIS WITHOUT CONTRAST TECHNIQUE: Multidetector CT imaging of the pelvis was performed following the standard protocol without intravenous contrast. COMPARISON:  X-ray from same day there be stone the portion urinary. There is a probable stone in the dependent portion of the urinary bladder. FINDINGS: Urinary Tract: There is a stone in the dependent portion of the urinary bladder. Bowel:  Unremarkable visualized pelvic bowel loops. Vascular/Lymphatic: There are advanced vascular calcifications visualized arterial structures. Reproductive:  No mass or other significant abnormality Other:  None. Musculoskeletal: Again noted is an acute displaced impacted subcapital/transcervical fracture of the proximal left femur. The appearance of the fracture is favored to be secondary to impaction as opposed to with underlying soft tissue lytic lesion. There are no lytic lesions identified in the remaining osseous structures. IMPRESSION: 1. Acute impacted fracture of the proximal left femur as detailed above. No convincing evidence for an underlying lytic soft tissue lesion. 2. Probable small bladder stone. 3.  Aortic Atherosclerosis (ICD10-I70.0). Electronically Signed   By: Katherine Mantle M.D.   On: 03/22/2020 18:29   DG Pelvis Portable  Result Date: 03/23/2020 CLINICAL DATA:  Status post hip arthroplasty EXAM:  PORTABLE PELVIS 1-2 VIEWS COMPARISON:  Mar 23, 2020 intraoperative images; pelvis and left hip radiographs Mar 22, 2020 FINDINGS: Frontal pelvis image obtained. There is a total hip replacement on the left with prosthetic components well-seated on frontal view. No acute fracture or dislocation evident postoperative. Slight narrowing right hip joint. 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 No acute fracture or dislocation evident currently. Slight narrowing right hip joint. Electronically Signed   By: Bretta Bang III M.D.   On: 03/23/2020 10:15   DG Knee Complete 4 Views Left  Result Date: 03/22/2020 CLINICAL DATA:  Pt fractured her left hip from a fall last night. Denies left knee pain. EXAM: LEFT KNEE - COMPLETE 4+ VIEW COMPARISON:  None. FINDINGS: No evidence of fracture, dislocation, or joint effusion. No evidence of arthropathy or other focal bone abnormality. There are vascular calcifications in the regional soft tissues. IMPRESSION: Negative radiographs of the left knee. Electronically Signed   By: Emmaline Kluver M.D.   On: 03/22/2020 18:33   DG C-Arm 1-60 Min  Result Date: 03/23/2020 CLINICAL DATA:  Hip replacement EXAM: DG C-ARM 1-60 MIN; OPERATIVE LEFT HIP WITH PELVIS FLUOROSCOPY TIME:  Fluoroscopy Time:  12 seconds COMPARISON:  None. FINDINGS: Frontal intraoperative radiographs demonstrate left total hip arthroplasty with acetabular screw. Standard appearance on this single view. IMPRESSION: Fluoroscopic guidance for left hip arthroplasty. Electronically Signed   By: Guadlupe Spanish M.D.   On: 03/23/2020 09:35   DG HIP OPERATIVE UNILAT WITH PELVIS LEFT  Result Date: 03/23/2020 CLINICAL DATA:  Hip replacement EXAM: DG C-ARM 1-60 MIN; OPERATIVE LEFT HIP WITH PELVIS FLUOROSCOPY TIME:  Fluoroscopy Time:  12 seconds COMPARISON:  None. FINDINGS: Frontal intraoperative radiographs demonstrate left total hip  arthroplasty with acetabular screw. Standard appearance on this single view. IMPRESSION: Fluoroscopic guidance for left hip arthroplasty. Electronically Signed   By: Guadlupe Spanish M.D.   On: 03/23/2020 09:35   DG Hip Unilat W or Wo Pelvis 2-3 Views Left  Result Date: 03/22/2020 CLINICAL DATA:  Hip pain. EXAM: DG HIP (WITH OR WITHOUT PELVIS) 2-3V LEFT COMPARISON:  None. FINDINGS: There is an acute displaced subcapital fracture of the proximal left femur. There is no dislocation. The fracture plane appears irregular. There are degenerative changes of both hips. IMPRESSION: Acute displaced subcapital fracture of the proximal left femur. The fracture plane appears irregular which may be secondary to impaction. However, an underlying lytic lesion should be considered and can be further evaluated with cross-sectional imaging as clinically indicated. Electronically Signed   By: Katherine Mantle M.D.   On: 03/22/2020 15:34    Scheduled Meds: . aspirin  81 mg Oral BID  . divalproex  500 mg Oral BID  . docusate sodium  100 mg Oral BID  . folic acid  1 mg Oral Daily  . gabapentin  100 mg Oral TID  . HYDROmorphone      . insulin aspart      . insulin aspart  0-9 Units Subcutaneous Q4H  . multivitamin with minerals  1 tablet Oral Daily  . pantoprazole  40 mg Oral Daily  . senna  1 tablet Oral Daily  . sodium chloride flush  3 mL Intravenous Q12H  . thiamine  100 mg Oral Daily   Continuous Infusions: . sodium chloride 75 mL/hr at 03/23/20 1539  .  ceFAZolin (ANCEF) IV Stopped (03/23/20 1318)  . methocarbamol (ROBAXIN) IV 500 mg (03/23/20 1042)     LOS: 1 day   Time spent: 25 minutes.  Tyrone Nine, MD [facility] [email or web address] 03/23/2020, 6:02 PM

## 2020-03-23 NOTE — Anesthesia Postprocedure Evaluation (Signed)
Anesthesia Post Note  Patient: Brazil Voytko  Procedure(s) Performed: TOTAL HIP ARTHROPLASTY ANTERIOR APPROACH (Left Hip)     Patient location during evaluation: PACU Anesthesia Type: General Level of consciousness: awake and alert Pain management: pain level controlled Vital Signs Assessment: post-procedure vital signs reviewed and stable Respiratory status: spontaneous breathing, nonlabored ventilation, respiratory function stable and patient connected to nasal cannula oxygen Cardiovascular status: blood pressure returned to baseline and stable Postop Assessment: no apparent nausea or vomiting Anesthetic complications: no    Last Vitals:  Vitals:   03/23/20 1045 03/23/20 1100  BP: 137/74 127/74  Pulse: (!) 103 97  Resp: (!) 23 (!) 27  Temp:  37.1 C  SpO2: 97% 100%    Last Pain:  Vitals:   03/23/20 1100  TempSrc:   PainSc: 2                  Lucretia Kern

## 2020-03-23 NOTE — Interval H&P Note (Signed)
History and Physical Interval Note:  03/23/2020 7:08 AM  Ashley Mcgee  has presented today for surgery, with the diagnosis of Left Femoral Head Fracture.  The various methods of treatment have been discussed with the patient and family. After consideration of risks, benefits and other options for treatment, the patient has consented to  Procedure(s): TOTAL HIP ARTHROPLASTY ANTERIOR APPROACH (Left) as a surgical intervention.  The patient's history has been reviewed, patient examined, no change in status, stable for surgery.  I have reviewed the patient's chart and labs.  Questions were answered to the patient's satisfaction.     Shelda Pal

## 2020-03-24 ENCOUNTER — Encounter (INDEPENDENT_AMBULATORY_CARE_PROVIDER_SITE_OTHER): Payer: Self-pay

## 2020-03-24 LAB — BASIC METABOLIC PANEL
Anion gap: 9 (ref 5–15)
BUN: 9 mg/dL (ref 6–20)
CO2: 23 mmol/L (ref 22–32)
Calcium: 7.7 mg/dL — ABNORMAL LOW (ref 8.9–10.3)
Chloride: 97 mmol/L — ABNORMAL LOW (ref 98–111)
Creatinine, Ser: 0.61 mg/dL (ref 0.44–1.00)
GFR calc Af Amer: >60 mL/min (ref >60)
GFR calc non Af Amer: >60 mL/min (ref >60)
Glucose, Bld: 190 mg/dL — ABNORMAL HIGH (ref 70–99)
Potassium: 4 mmol/L (ref 3.5–5.1)
Sodium: 129 mmol/L — ABNORMAL LOW (ref 135–145)

## 2020-03-24 LAB — GLUCOSE, CAPILLARY
Glucose-Capillary: 187 mg/dL — ABNORMAL HIGH (ref 70–99)
Glucose-Capillary: 251 mg/dL — ABNORMAL HIGH (ref 70–99)
Glucose-Capillary: 185 mg/dL — ABNORMAL HIGH (ref 70–99)
Glucose-Capillary: 252 mg/dL — ABNORMAL HIGH (ref 70–99)

## 2020-03-24 LAB — CBC
HCT: 25.7 % — ABNORMAL LOW (ref 36.0–46.0)
Hemoglobin: 8.7 g/dL — ABNORMAL LOW (ref 12.0–15.0)
MCH: 33.2 pg (ref 26.0–34.0)
MCHC: 33.9 g/dL (ref 30.0–36.0)
MCV: 98.1 fL (ref 80.0–100.0)
Platelets: 157 10*3/uL (ref 150–400)
RBC: 2.62 MIL/uL — ABNORMAL LOW (ref 3.87–5.11)
RDW: 11.4 % — ABNORMAL LOW (ref 11.5–15.5)
WBC: 9.9 10*3/uL (ref 4.0–10.5)
nRBC: 0 % (ref 0.0–0.2)

## 2020-03-24 NOTE — Progress Notes (Signed)
Physical Therapy Treatment Patient Details Name: Ashley Mcgee MRN: [id number] DOB: 1971/06/28 Today's Date: 03/24/2020    History of Present Illness 49 y.o. female with a history of alcohol abuse, poorly-controlled T2DM with diabetic neuropathy, HTN, HLD, tobacco use who presented to the ED from home after a fall last night with left hip pain.  imaging revealed. Displaced left femoral neck fracture.Pt is s/p L DA THA per Dr. Alvan Dame on 03/23/20    PT Comments    Pt up to [facility] multiple times per her report, pt states it is getting easier every time.  Reviewed  THA HEP and pt tol well. Will continue to follow in acute setting. Offered to order PT and pt states she does not really feel it is needed.   Follow Up Recommendations  Home health PT      Equipment Recommendations  Rolling walker with 5" wheels;3in1 (PT)    Recommendations for Other Services       Precautions / Restrictions Precautions Precautions: Fall Restrictions Weight Bearing Restrictions: No LLE Weight Bearing: Weight bearing as tolerated Other Position/Activity Restrictions: WBAT    Mobility  Bed Mobility Overal bed mobility: Needs Assistance Bed Mobility: Supine to Sit;Sit to Supine     Supine to sit: Min guard;HOB elevated Sit to supine: Min guard   General bed mobility comments: incr time and effort, pt is able to self assist with gait belt as leg lifter  Transfers Overall transfer level: Needs assistance Equipment used: Rolling walker (2 wheeled) Transfers: Sit to/from [facility] Sit to Stand: Min assist Stand pivot transfers: Min assist       General transfer comment: cues for hand placement and LLE position  Ambulation/Gait Ambulation/Gait assistance: Min assist Gait Distance (Feet): 20 Feet   Gait Pattern/deviations: Step-to pattern;Decreased stance time - left;Decreased weight shift to left     General Gait Details: pt self assists with advancing LLE, prefers PT not to  assist. incr time and effort for. min for balance and safety   Stairs             Wheelchair Mobility    Modified Rankin (Stroke Patients Only)       Balance Overall balance assessment: Needs assistance Sitting-balance support: Feet supported;No upper extremity supported Sitting balance-Leahy Scale: Fair Sitting balance - Comments: pain limiting     Standing balance-Leahy Scale: Poor Standing balance comment: reliant on UEs                            Cognition Arousal/Alertness: Awake/alert Behavior During Therapy: WFL for tasks assessed/performed Overall Cognitive Status: Within Functional Limits for tasks assessed                                        Exercises Total Joint Exercises Ankle Circles/Pumps: AROM;Both;20 reps Quad Sets: AROM;Both;10 reps Short Arc Quad: AROM;Left;10 reps Heel Slides: AAROM;Left;10 reps Heel Slides Limitations: pain Hip ABduction/ADduction: AROM;AAROM;Left;10 reps    General Comments        Pertinent Vitals/Pain Pain Assessment: Faces Faces Pain Scale: Hurts even more Pain Location: left LE Pain Descriptors / Indicators: Grimacing;Sore;Discomfort Pain Intervention(s): Limited activity within patient's tolerance;Monitored during session;Repositioned;Ice applied    Home Living Family/patient expects to be discharged to:: Private residence Living Arrangements: Alone   Type of Home: Apartment Home Access: Stairs to enter   Home Layout: One level  Home Equipment: None      Prior Function Level of Independence: Independent      Comments: drives   PT Goals (current goals can now be found in the care plan section) Acute Rehab PT Goals Patient Stated Goal: home PT Goal Formulation: With patient Time For Goal Achievement: 03/31/20 Potential to Achieve Goals: Good Progress towards PT goals: Progressing toward goals    Frequency    7X/week      PT Plan Current plan remains appropriate     Co-evaluation              AM-PAC PT "6 Clicks" Mobility   Outcome Measure  Help needed turning from your back to your side while in a flat bed without using bedrails?: A Little Help needed moving from lying on your back to sitting on the side of a flat bed without using bedrails?: A Little Help needed moving to and from a bed to a chair (including a wheelchair)?: A Little Help needed standing up from a chair using your arms (e.g., wheelchair or bedside chair)?: A Little Help needed to walk in hospital room?: A Little Help needed climbing 3-5 steps with a railing? : A Lot 6 Click Score: 17    End of Session Equipment Utilized During Treatment: Gait belt Activity Tolerance: Patient tolerated treatment well Patient left: with call bell/phone within reach;in bed;with bed alarm set Nurse Communication: Mobility status PT Visit Diagnosis: Difficulty in walking, not elsewhere classified (R26.2);History of falling (Z91.81)     Time: 6962-9528 PT Time Calculation (min) (ACUTE ONLY): 13 min  Charges:  $Gait Training: 8-22 mins $Therapeutic Exercise: 8-22 mins                     Delice Bison, PT   Acute Rehab Dept [facility]): [phone]   03/24/2020    [facility] 03/24/2020, 4:02 PM

## 2020-03-24 NOTE — Progress Notes (Signed)
Subjective: 1 Day Post-Op Procedure(s) (LRB): TOTAL HIP ARTHROPLASTY ANTERIOR APPROACH (Left)  Patient reports pain as mild to moderate.  Denies fever, chills, N/V, CP, SOB.  Tolerating POs well.  Reports that she was able to take a few steps yesterday.  Hopeful to be D/C'd home tomorrow.  Objective:   VITALS:  Temp:  [98.2 F (36.8 C)-100.1 F (37.8 C)] 98.2 F (36.8 C) (05/31 0525) Pulse Rate:  [76-113] 79 (05/31 0525) Resp:  [18-30] 18 (05/31 0525) BP: (112-159)/(57-76) 132/67 (05/31 0525) SpO2:  [91 %-100 %] 100 % (05/31 0525)  General: WDWN patient in NAD. Psych:  Appropriate mood and affect. Neuro:  A&O x 3, Moving all extremities, sensation intact to light touch HEENT:  EOMs intact Chest:  Even non-labored respirations Skin:  Dressing C/D/I, no rashes or lesions Extremities: warm/dry, mild edema to L hip, no erythema or echymosis.  No lymphadenopathy. Pulses: Popliteus 2+ MSK:  ROM: TKE, MMT: able to perform quad set, (-) Homan's    LABS Recent Labs    03/22/20 1419 03/22/20 1419 03/23/20 0445 03/23/20 0728 03/24/20 0418  HGB 14.6  --  12.6 11.2* 8.7*  WBC 15.2*   < > 13.0*  --  9.9  PLT 305   < > 237  --  157   < > = values in this interval not displayed.   Recent Labs    03/23/20 0445 03/23/20 0445 03/23/20 0728 03/24/20 0418  NA 126*   < > 129* 129*  K 3.7   < > 3.7 4.0  CL 92*   < > 91* 97*  CO2 25  --   --  23  BUN 8   < > 6 9  CREATININE 0.79   < > 0.70 0.61  GLUCOSE 224*   < > 209* 190*   < > = values in this interval not displayed.   Recent Labs    03/23/20 0445  INR 1.1     Assessment/Plan: 1 Day Post-Op Procedure(s) (LRB): TOTAL HIP ARTHROPLASTY ANTERIOR APPROACH (Left)  Patient seen in rounds for Dr. Kathleen Lime LE Up with therapy Disp: pending Plan for outpatient post-op visit with Dr. Genevie Cheshire PA-C [facility] Office:  [phone]

## 2020-03-24 NOTE — Progress Notes (Addendum)
PROGRESS NOTE  Ashley Mcgee  XBJ:[id number] DOB: April 06, 1971 DOA: 03/22/2020 PCP: Cain Saupe, MD   Brief Narrative: Ashley Mcgee is a 49 y.o. female with a history of alcohol abuse, poorly-controlled T2DM with diabetic neuropathy, HTN, HLD, and tobacco use who presented to the ED 5/29 after a fall at home the previous evening found to have displaced left hip fracture. She was admitted and taken for THA on 5/30.   Assessment & Plan: Principal Problem:   Closed left hip fracture (HCC) Active Problems:   Alcohol dependence in remission (HCC)   Hyperlipidemia   Diabetic peripheral neuropathy (HCC)   Tobacco dependence   Type 2 diabetes with complication (HCC)   Hyponatremia  Left hip fracture due to fall at home: s/p anterior THA by Dr. Charlann Boxer 5/30. Note CT showed no evidence of underlying lytic lesion.  - PT/OT today, WBAT per ortho.  - ASA 81mg  po BID per orthopedics for DVT ppx - Pain control adequate, no changes to orders. - Outpatient f/u with Dr. .  Acute blood loss anemia:  - Monitor CBC in AM. No external or GI bleeding noted. Hemodynamically stable.  T2DM with diabetic neuropathy uncontrolled with hyperglycemia:  - Augmented SSI, changed to ACHS. Will monitor CBGs today to decide whether further titration necessary. - Restarted gabapentin at lower dose as she's not been taking this regularly.  - Hold glipizide, metformin, states she's not taking lantus as it is on her MAR.   Hyponatremia: Exaggerated by hyperglycemia, improved with isotonic saline at an acceptable rate.  - DC IVF since taking adequate po. - Monitor BMP.  HLD:  - Pt not taking statin  HTN:  - Pt not taking lisinopril  Tobacco use: ~1ppd - Nicotine patch prn  History of alcohol abuse, current alcohol use:  - EtOH level was not checked at arrival, but at 7pm on the day of admission it was still detectable, so suspect intoxication likely played a roll in fall - Monitor clinically, though  not in withdrawal at this time.  - Empiric vitamins as ordered.   DVT prophylaxis: Aspirin 81mg  BID per ortho orders Code Status: Full Family Communication: None Disposition Plan:  Status is: Inpatient  Remains inpatient appropriate because:Ongoing active pain requiring inpatient pain management and Unsafe d/c plan   Dispo: The patient is from: Home              Anticipated d/c is to: TBD based on PT/OT and postoperative course              Anticipated d/c date is: 1 day              Patient currently is not medically stable to d/c.  Consultants:   Orthopedics  Procedures:   Left THA  Antimicrobials:  Ancef perioperatively   Subjective: Pain in left hip is controlled, moderate with movement, mild at rest, better with pain medications. Eager to get up. Eating very well.  Objective: Vitals:   03/23/20 1846 03/23/20 2140 03/24/20 0148 03/24/20 0525  BP: (!) 126/57 (!) 159/73 (!) 159/76 132/67  Pulse: 85 86 76 79  Resp: 18 18 18 18   Temp: 98.4 F (36.9 C) 98.3 F (36.8 C) 98.3 F (36.8 C) 98.2 F (36.8 C)  TempSrc: Oral Oral Oral Oral  SpO2: 99% 100% 100% 100%  Weight:      Height:        Intake/Output Summary (Last 24 hours) at 03/24/2020 0833 Last data filed at 03/24/2020 0640 Gross per 24  hour  Intake 5277.21 ml  Output 5800 ml  Net -522.79 ml   Filed Weights   03/22/20 2255  Weight: 96.4 kg   Gen: 49 y.o. female in no distress Pulm: Nonlabored breathing room air. Clear. CV: Regular rate and rhythm. No murmur, rub, or gallop. No JVD, no dependent edema. GI: Abdomen soft, non-tender, non-distended, with normoactive bowel sounds.  Ext: Warm, no deformities. LLE with stable 5/5 strength throughout, limited hip flexion from pain, sensation intact in stable distribution of distal numbness symmetric with RLE.  Skin: No new rashes, lesions or ulcers on visualized skin. Left anterior hip with surgical dressing c/d/i, no settling ecchymosis or extending erythema,  appropriately sore to palpation. Neuro: Alert and oriented. No focal neurological deficits. Psych: Judgement and insight appear fair. Mood euthymic & affect congruent. Behavior is appropriate.    Data Reviewed: I have personally reviewed following labs and imaging studies  CBC: Recent Labs  Lab 03/22/20 1419 03/23/20 0445 03/23/20 0728 03/24/20 0418  WBC 15.2* 13.0*  --  9.9  NEUTROABS 13.1*  --   --   --   HGB 14.6 12.6 11.2* 8.7*  HCT 41.4 36.9 33.0* 25.7*  MCV 94.1 95.8  --  98.1  PLT 305 237  --  157   Basic Metabolic Panel: Recent Labs  Lab 03/22/20 1419 03/23/20 0445 03/23/20 0728 03/24/20 0418  NA 122* 126* 129* 129*  K 3.8 3.7 3.7 4.0  CL 85* 92* 91* 97*  CO2 25 25  --  23  GLUCOSE 257* 224* 209* 190*  BUN 9 8 6 9   CREATININE 0.52 0.79 0.70 0.61  CALCIUM 9.3 8.2*  --  7.7*   Liver Function Tests: Recent Labs  Lab 03/22/20 1419  AST 34  ALT 30  ALKPHOS 79  BILITOT 0.7  PROT 8.5*  ALBUMIN 4.6   Coagulation Profile: Recent Labs  Lab 03/23/20 0445  INR 1.1   Cardiac Enzymes: Recent Labs  Lab 03/22/20 1419  CKTOTAL 258*   Urine analysis:    Component Value Date/Time   COLORURINE Yellow 03/21/2014 1209   APPEARANCEUR Clear 03/21/2014 1209   LABSPEC 1.015 03/21/2014 1209   PHURINE 5.0 03/21/2014 1209   GLUCOSEU >=500 03/21/2014 1209   HGBUR Negative 03/21/2014 1209   BILIRUBINUR negative 12/25/2018 1413   BILIRUBINUR Negative 03/21/2014 1209   KETONESUR negative 12/25/2018 1413   KETONESUR Negative 03/21/2014 1209   PROTEINUR Negative 03/21/2014 1209   UROBILINOGEN 0.2 12/25/2018 1413   NITRITE Negative 12/25/2018 1413   NITRITE Negative 03/21/2014 1209   LEUKOCYTESUR Negative 12/25/2018 1413   LEUKOCYTESUR Negative 03/21/2014 1209   Recent Results (from the past 240 hour(s))  SARS Coronavirus 2 by RT PCR (hospital order, performed in [facility] hospital lab) Nasopharyngeal Nasopharyngeal Swab     Status: None   Collection Time:  03/22/20  4:52 PM   Specimen: Nasopharyngeal Swab  Result Value Ref Range Status   SARS Coronavirus 2 NEGATIVE NEGATIVE Final    Comment: (NOTE) SARS-CoV-2 target nucleic acids are NOT DETECTED. The SARS-CoV-2 RNA is generally detectable in upper and lower respiratory specimens during the acute phase of infection. The lowest concentration of SARS-CoV-2 viral copies this assay can detect is 250 copies / mL. A negative result does not preclude SARS-CoV-2 infection and should not be used as the sole basis for treatment or other patient management decisions.  A negative result may occur with improper specimen collection / handling, submission of specimen other than nasopharyngeal swab, presence of  viral mutation(s) within the areas targeted by this assay, and inadequate number of viral copies (<250 copies / mL). A negative result must be combined with clinical observations, patient history, and epidemiological information. Fact Sheet for Patients:   BoilerBrush.com.cy Fact Sheet for Healthcare Providers: https://pope.com/ This test is not yet approved or cleared  by the [place] FDA and has been authorized for detection and/or diagnosis of SARS-CoV-2 by FDA under an Emergency Use Authorization (EUA).  This EUA will remain in effect (meaning this test can be used) for the duration of the COVID-19 declaration under Section 564(b)(1) of the Act, 21 U.S.C. section 360bbb-3(b)(1), unless the authorization is terminated or revoked sooner. Performed at [facility], [address], [place], [place] [address]   MRSA PCR Screening     Status: None   Collection Time: 03/23/20 12:50 AM   Specimen: Nasal Mucosa; Nasopharyngeal  Result Value Ref Range Status   MRSA by PCR NEGATIVE NEGATIVE Final    Comment:        The GeneXpert MRSA Assay (FDA approved for NASAL specimens only), is one component of a comprehensive MRSA  colonization surveillance program. It is not intended to diagnose MRSA infection nor to guide or monitor treatment for MRSA infections. Performed at [facility], [address] [place], [place] [address]       Radiology Studies: CT PELVIS WO CONTRAST  Result Date: 03/22/2020 CLINICAL DATA:  Fracture. EXAM: CT PELVIS WITHOUT CONTRAST TECHNIQUE: Multidetector CT imaging of the pelvis was performed following the standard protocol without intravenous contrast. COMPARISON:  X-ray from same day there be stone the portion urinary. There is a probable stone in the dependent portion of the urinary bladder. FINDINGS: Urinary Tract: There is a stone in the dependent portion of the urinary bladder. Bowel:  Unremarkable visualized pelvic bowel loops. Vascular/Lymphatic: There are advanced vascular calcifications visualized arterial structures. Reproductive:  No mass or other significant abnormality Other:  None. Musculoskeletal: Again noted is an acute displaced impacted subcapital/transcervical fracture of the proximal left femur. The appearance of the fracture is favored to be secondary to impaction as opposed to with underlying soft tissue lytic lesion. There are no lytic lesions identified in the remaining osseous structures. IMPRESSION: 1. Acute impacted fracture of the proximal left femur as detailed above. No convincing evidence for an underlying lytic soft tissue lesion. 2. Probable small bladder stone. 3.  Aortic Atherosclerosis (ICD10-I70.0). Electronically Signed   By: Katherine Mantle M.D.   On: 03/22/2020 18:29   DG Pelvis Portable  Result Date: 03/23/2020 CLINICAL DATA:  Status post hip arthroplasty EXAM: PORTABLE PELVIS 1-2 VIEWS COMPARISON:  Mar 23, 2020 intraoperative images; pelvis and left hip radiographs Mar 22, 2020 FINDINGS: Frontal pelvis image obtained. There is a total hip replacement on the left with prosthetic components well-seated on frontal view. No acute  fracture or dislocation evident postoperative. Slight narrowing right hip joint. 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 No acute fracture or dislocation evident currently. Slight narrowing right hip joint. Electronically Signed   By: Bretta Bang III M.D.   On: 03/23/2020 10:15   DG Knee Complete 4 Views Left  Result Date: 03/22/2020 CLINICAL DATA:  Pt fractured her left hip from a fall last night. Denies left knee pain. EXAM: LEFT KNEE - COMPLETE 4+ VIEW COMPARISON:  None. FINDINGS: No evidence of fracture, dislocation, or joint effusion. No evidence of arthropathy or other focal  bone abnormality. There are vascular calcifications in the regional soft tissues. IMPRESSION: Negative radiographs of the left knee. Electronically Signed   By: Audie Pinto M.D.   On: 03/22/2020 18:33   DG C-Arm 1-60 Min  Result Date: 03/23/2020 CLINICAL DATA:  Hip replacement EXAM: DG C-ARM 1-60 MIN; OPERATIVE LEFT HIP WITH PELVIS FLUOROSCOPY TIME:  Fluoroscopy Time:  12 seconds COMPARISON:  None. FINDINGS: Frontal intraoperative radiographs demonstrate left total hip arthroplasty with acetabular screw. Standard appearance on this single view. IMPRESSION: Fluoroscopic guidance for left hip arthroplasty. Electronically Signed   By: Macy Mis M.D.   On: 03/23/2020 09:35   DG HIP OPERATIVE UNILAT WITH PELVIS LEFT  Result Date: 03/23/2020 CLINICAL DATA:  Hip replacement EXAM: DG C-ARM 1-60 MIN; OPERATIVE LEFT HIP WITH PELVIS FLUOROSCOPY TIME:  Fluoroscopy Time:  12 seconds COMPARISON:  None. FINDINGS: Frontal intraoperative radiographs demonstrate left total hip arthroplasty with acetabular screw. Standard appearance on this single view. IMPRESSION: Fluoroscopic guidance for left hip arthroplasty. Electronically Signed   By: Macy Mis M.D.   On: 03/23/2020 09:35   DG Hip Unilat W or Wo Pelvis 2-3 Views Left  Result Date:  03/22/2020 CLINICAL DATA:  Hip pain. EXAM: DG HIP (WITH OR WITHOUT PELVIS) 2-3V LEFT COMPARISON:  None. FINDINGS: There is an acute displaced subcapital fracture of the proximal left femur. There is no dislocation. The fracture plane appears irregular. There are degenerative changes of both hips. IMPRESSION: Acute displaced subcapital fracture of the proximal left femur. The fracture plane appears irregular which may be secondary to impaction. However, an underlying lytic lesion should be considered and can be further evaluated with cross-sectional imaging as clinically indicated. Electronically Signed   By: Constance Holster M.D.   On: 03/22/2020 15:34    Scheduled Meds: . aspirin  81 mg Oral BID  . divalproex  500 mg Oral BID  . docusate sodium  100 mg Oral BID  . folic acid  1 mg Oral Daily  . gabapentin  100 mg Oral TID  . insulin aspart  0-15 Units Subcutaneous TID WC  . insulin aspart  0-5 Units Subcutaneous QHS  . multivitamin with minerals  1 tablet Oral Daily  . pantoprazole  40 mg Oral Daily  . senna  1 tablet Oral Daily  . sodium chloride flush  3 mL Intravenous Q12H  . thiamine  100 mg Oral Daily   Continuous Infusions: . sodium chloride 10 mL/hr at 03/24/20 0600  . methocarbamol (ROBAXIN) IV 500 mg (03/23/20 1042)     LOS: 2 days   Time spent: 25 minutes.  Patrecia Pour, MD [facility] [email or web address] 03/24/2020, 8:33 AM

## 2020-03-24 NOTE — Evaluation (Signed)
Physical Therapy Evaluation Patient Details Name: Ashley Mcgee MRN: [id number] DOB: 03-May-1971 Today's Date: 03/24/2020   History of Present Illness  49 y.o. female with a history of alcohol abuse, poorly-controlled T2DM with diabetic neuropathy, HTN, HLD, tobacco use who presented to the ED from home after a fall last night with left hip pain.  imaging revealed. Displaced left femoral neck fracture.Pt is s/p L DA THA per Dr. Alvan Dame on 03/23/20  Clinical Impression  Pt is s/p THA resulting in the deficits listed below (see PT Problem List).  Pt able to amb short distance with incr time and effort; will continue to follow in acute setting, monitor prgress and d/c needs Pt will benefit from skilled PT to increase their independence and safety with mobility to allow discharge to the venue listed below.      Follow Up Recommendations Home health PT    Equipment Recommendations  Rolling walker with 5" wheels;3in1 (PT)    Recommendations for Other Services       Precautions / Restrictions Precautions Precautions: Fall Restrictions Weight Bearing Restrictions: No LLE Weight Bearing: Weight bearing as tolerated      Mobility  Bed Mobility Overal bed mobility: Needs Assistance Bed Mobility: Supine to Sit;Sit to Supine     Supine to sit: Min guard;HOB elevated Sit to supine: Min guard   General bed mobility comments: incr time and effort, pt is able to self assist with gait belt as leg lifter  Transfers Overall transfer level: Needs assistance Equipment used: Rolling walker (2 wheeled) Transfers: Sit to/from [facility] Sit to Stand: Min assist Stand pivot transfers: Min assist       General transfer comment: cues for hand placement and LLE position  Ambulation/Gait Ambulation/Gait assistance: Min assist Gait Distance (Feet): 20 Feet   Gait Pattern/deviations: Step-to pattern;Decreased stance time - left;Decreased weight shift to left     General Gait  Details: pt self assists with advancing LLE, prefers PT not to assist. incr time and effort for. min for balance and safety  Stairs            Wheelchair Mobility    Modified Rankin (Stroke Patients Only)       Balance Overall balance assessment: Needs assistance Sitting-balance support: Feet supported;No upper extremity supported Sitting balance-Leahy Scale: Fair Sitting balance - Comments: pain limiting     Standing balance-Leahy Scale: Poor Standing balance comment: reliant on UEs                             Pertinent Vitals/Pain Pain Assessment: Faces Faces Pain Scale: Hurts whole lot Pain Location: left LE Pain Descriptors / Indicators: Grimacing;Sore;Discomfort Pain Intervention(s): Limited activity within patient's tolerance;Monitored during session;Premedicated before session;Repositioned    Home Living Family/patient expects to be discharged to:: Private residence Living Arrangements: Alone   Type of Home: Apartment Home Access: Stairs to enter   [employer] of Steps: curb and one small step Home Layout: One level Home Equipment: None      Prior Function Level of Independence: Independent         Comments: drives     Hand Dominance        Extremity/Trunk Assessment   Upper Extremity Assessment Upper Extremity Assessment: Overall WFL for tasks assessed    Lower Extremity Assessment Lower Extremity Assessment: LLE deficits/detail LLE Deficits / Details: ankle WFL, knee and hip 2+/5, limited by post op pain and weakness  Communication      Cognition Arousal/Alertness: Awake/alert Behavior During Therapy: WFL for tasks assessed/performed Overall Cognitive Status: Within Functional Limits for tasks assessed                                        General Comments      Exercises Total Joint Exercises Ankle Circles/Pumps: AROM;10 reps;Both Quad Sets: AROM;Both;5 reps Heel Slides:  AAROM;Limitations;Left Heel Slides Limitations: pain   Assessment/Plan    PT Assessment Patient needs continued PT services  PT Problem List Decreased strength;Decreased range of motion;Decreased activity tolerance;Decreased balance;Decreased knowledge of use of DME;Decreased mobility;Pain       PT Treatment Interventions DME instruction;Therapeutic exercise;Gait training;Cognitive remediation;Therapeutic activities;Patient/family education;Functional mobility training;Stair training    PT Goals (Current goals can be found in the Care Plan section)  Acute Rehab PT Goals Patient Stated Goal: home PT Goal Formulation: With patient Time For Goal Achievement: 03/31/20 Potential to Achieve Goals: Good    Frequency 7X/week   Barriers to discharge        Co-evaluation               AM-PAC PT "6 Clicks" Mobility  Outcome Measure Help needed turning from your back to your side while in a flat bed without using bedrails?: A Little Help needed moving from lying on your back to sitting on the side of a flat bed without using bedrails?: A Little Help needed moving to and from a bed to a chair (including a wheelchair)?: A Little Help needed standing up from a chair using your arms (e.g., wheelchair or bedside chair)?: A Little Help needed to walk in hospital room?: A Little Help needed climbing 3-5 steps with a railing? : A Lot 6 Click Score: 17    End of Session Equipment Utilized During Treatment: Gait belt Activity Tolerance: Patient tolerated treatment well Patient left: with call bell/phone within reach;in bed;with bed alarm set Nurse Communication: Mobility status PT Visit Diagnosis: Difficulty in walking, not elsewhere classified (R26.2);History of falling (Z91.81)    Time: 3300-7622 PT Time Calculation (min) (ACUTE ONLY): 27 min   Charges:   PT Evaluation $PT Eval Low Complexity: 1 Low          Mahrosh Donnell, PT   Acute Rehab Dept [facility]):  [phone]   03/24/2020   [facility] 03/24/2020, 12:25 PM

## 2020-03-25 ENCOUNTER — Encounter: Payer: Self-pay | Admitting: *Deleted

## 2020-03-25 LAB — CBC
HCT: 24.8 % — ABNORMAL LOW (ref 36.0–46.0)
Hemoglobin: 8.3 g/dL — ABNORMAL LOW (ref 12.0–15.0)
MCH: 32.7 pg (ref 26.0–34.0)
MCHC: 33.5 g/dL (ref 30.0–36.0)
MCV: 97.6 fL (ref 80.0–100.0)
Platelets: 143 10*3/uL — ABNORMAL LOW (ref 150–400)
RBC: 2.54 MIL/uL — ABNORMAL LOW (ref 3.87–5.11)
RDW: 11.4 % — ABNORMAL LOW (ref 11.5–15.5)
WBC: 9 10*3/uL (ref 4.0–10.5)
nRBC: 0 % (ref 0.0–0.2)

## 2020-03-25 LAB — BASIC METABOLIC PANEL
Anion gap: 7 (ref 5–15)
BUN: 10 mg/dL (ref 6–20)
CO2: 25 mmol/L (ref 22–32)
Calcium: 7.9 mg/dL — ABNORMAL LOW (ref 8.9–10.3)
Chloride: 94 mmol/L — ABNORMAL LOW (ref 98–111)
Creatinine, Ser: 0.66 mg/dL (ref 0.44–1.00)
GFR calc Af Amer: >60 mL/min (ref >60)
GFR calc non Af Amer: >60 mL/min (ref >60)
Glucose, Bld: 272 mg/dL — ABNORMAL HIGH (ref 70–99)
Potassium: 4.3 mmol/L (ref 3.5–5.1)
Sodium: 126 mmol/L — ABNORMAL LOW (ref 135–145)

## 2020-03-25 LAB — GLUCOSE, CAPILLARY
Glucose-Capillary: 253 mg/dL — ABNORMAL HIGH (ref 70–99)
Glucose-Capillary: 264 mg/dL — ABNORMAL HIGH (ref 70–99)

## 2020-03-25 MED ORDER — GABAPENTIN 300 MG PO CAPS: 300.0000 mg | ORAL_CAPSULE | Freq: Three times a day (TID) | ORAL | 0 refills | Status: DC

## 2020-03-25 MED ORDER — HYDROCODONE-ACETAMINOPHEN 7.5-325 MG PO TABS: 1.0000 | ORAL_TABLET | Freq: Four times a day (QID) | ORAL | 0 refills | Status: AC | PRN

## 2020-03-25 MED ORDER — ASPIRIN 81 MG PO CHEW: 81.0000 mg | CHEWABLE_TABLET | Freq: Two times a day (BID) | ORAL | 0 refills | Status: DC

## 2020-03-25 MED ORDER — METFORMIN HCL 1000 MG PO TABS: 500.0000 mg | ORAL_TABLET | Freq: Two times a day (BID) | ORAL | 0 refills | Status: DC

## 2020-03-25 NOTE — Discharge Summary (Signed)
Physician Discharge Summary  Ashley Mcgee AOZ:[id number] DOB: October 13, 1971 DOA: 03/22/2020  PCP: Cain Saupe, MD  Admit date: 03/22/2020 Discharge date: 03/25/2020  Admitted From: Home Disposition: Home   Recommendations for Outpatient Follow-up:  1. Follow up with PCP in 1-2 weeks with attention on diabetes management.  2. Repeat CBC at follow up 3. Follow up with orthopedics 2 weeks  Home Health: PT, OT Equipment/Devices: 3 in 1, rolling walker Discharge Condition: Stable CODE STATUS: Full Diet recommendation: Carb-modified  Brief/Interim Summary: Ashley Mcgee a 49 y.o.femalewith a history ofalcohol abuse, poorly-controlled T2DM with diabetic neuropathy, HTN, HLD, and tobacco use who presented to the ED 5/29 after a fall at home the previous evening found to have displaced left hip fracture. She was admitted and taken for THA on 5/30. Postoperative course complicated by asymptomatic acute blood loss anemia not requiring transfusion.   Discharge Diagnoses:  Principal Problem:   Closed left hip fracture (HCC) Active Problems:   Alcohol dependence in remission (HCC)   Hyperlipidemia   Diabetic peripheral neuropathy (HCC)   Tobacco dependence   Type 2 diabetes with complication (HCC)   Hyponatremia  Left hip fracture due to fall at home: s/p anterior THA by Dr. Charlann Boxer 5/30. Note CT showed no evidence of underlying lytic lesion.  - PT/OT at home, WBAT per ortho.  - ASA 81mg  po BID per orthopedics for DVT ppx - Pain control adequate. PDMP reviewed, no relevant prescriptions. Given hydrocodone for 5 day supply as below (prescribed electronically) - Outpatient f/u with Dr. . Continue aquacel until that time.  Acute blood loss anemia:  - Monitor CBC at follow up. No external or GI bleeding noted. Hemodynamically stable.  T2DM with diabetic neuropathy uncontrolled with hyperglycemia:  - Continue metformin (has not tolerated 1g BID, so Rx 500mg  BID) and follow up with  PCP soon - Restarted gabapentin at lower dose as she's not been taking this regularly.  - States she's not taking lantus as it is on her MAR, though based on inpatient values, 12u lantus would be a good start for management as outpatient.   Hyponatremia: Exaggerated by hyperglycemia, improved with isotonic saline at an acceptable rate, corrects to 129mg /dl.  - Monitor BMP, consider further work up if persistent.  HLD:  - Pt not taking statin  HTN:  - Pt not taking lisinopril  Tobacco use: ~1ppd - Nicotine patch while admitted, cessation counseling provided.  History of alcohol abuse, current alcohol use: - EtOH level was not checked at arrival, but at 7pm on the day of admission it was still detectable, so suspect intoxication likely played a roll in fall - No evidence of withdrawal throughout hospitalization  Discharge Instructions Discharge Instructions    Diet - low sodium heart healthy   Complete by: As directed    Diet Carb Modified   Complete by: As directed    Discharge instructions   Complete by: As directed    - Continue taking aspirin 81mg  twice daily to reduce risk of blood clots in the legs. You should also continue mobilizing as much as possible. Home health services (PT and OT) will be arranged in addition to medical equipment for you at home.  - Your blood sugars are not well controlled. This can cause you to need hospitalization and have delayed healing from this surgery, and will certainly cause worsening nerve pain and kidney dysfunction and retinal damage possibly causing blindness in addition to other major problems over the long term if this is not improved.  You need to follow up with your primary doctor, Dr. Fulp, in the next week to discuss this.  - Follow up with Dr. Charlann BoxerliJillyn Hiddenn (orthopedics) in 2 weeks - You may take tylenol, ibuprofen, and hydrocodone (prescribed) for pain after discharge.   Increase activity slowly   Complete by: As directed      Allergies  as of 03/25/2020   No Known Allergies     Medication List    STOP taking these medications   atorvastatin 40 MG tablet Commonly known as: LIPITOR   silver sulfADIAZINE 1 % cream Commonly known as: Silvadene     TAKE these medications   accu-chek soft touch lancets Use as instructed   aspirin 81 MG chewable tablet Chew 1 tablet (81 mg total) by mouth 2 (two) times daily.   divalproex 500 MG DR tablet Commonly known as: DEPAKOTE Take 500 mg by mouth 2 (two) times daily.   gabapentin 300 MG capsule Commonly known as: NEURONTIN Take 1 capsule (300 mg total) by mouth 3 (three) times daily. For nerve pain What changed: how much to take   glipiZIDE 10 MG tablet Commonly known as: GLUCOTROL Take 1 tablet (10 mg total) by mouth 2 (two) times daily before a meal.   glucose blood test strip Commonly known as: Accu-Chek Aviva Use as instructed to check blood sugar 2-3 times per day   HYDROcodone-acetaminophen 7.5-325 MG tablet Commonly known as: NORCO Take 1-2 tablets by mouth every 6 (six) hours as needed for up to 5 days for moderate pain or severe pain.   ibuprofen 200 MG tablet Commonly known as: ADVIL Take 200 mg by mouth every 6 (six) hours as needed for headache.   Lantus SoloStar 100 UNIT/ML Solostar Pen Generic drug: insulin glargine Inject 12 Units into the skin daily.   lisinopril 2.5 MG tablet Commonly known as: ZESTRIL Take 1 tablet (2.5 mg total) by mouth daily.   metFORMIN 1000 MG tablet Commonly known as: GLUCOPHAGE Take 0.5 tablets (500 mg total) by mouth 2 (two) times daily with a meal. What changed: how much to take   rosuvastatin 20 MG tablet Commonly known as: CRESTOR Take 1 tablet (20 mg total) by mouth daily. To lower cholesterol   TRUEplus Pen Needles 32G X 4 MM Misc Generic drug: Insulin Pen Needle Use to inject insulin daily.            Durable Medical Equipment  (From admission, onward)         Start     Ordered   03/25/20  0818  For home use only DME Walker rolling  Once    Question Answer Comment  Walker: With 5 Inch Wheels   Patient needs a walker to treat with the following condition Closed left hip fracture (HCC)      03/25/20 0817   03/23/20 1130  DME Walker rolling  Once    Question Answer Comment  Walker: With 5 Inch Wheels   Patient needs a walker to treat with the following condition Status post total hip replacement, left      03/23/20 1129   03/23/20 1130  DME 3 n 1  Once     03/23/20 1129         Follow-up Information    Fulp, Cammie, MD. Schedule an appointment as soon as possible for a visit in 1 week(s).   Specialty: Family Medicine Contact information: [address] [place] [id number] [phone]        Home,  [facility] Follow up.   Specialty: Home Health Services Why: to provide physical and occupational therapy Contact information: [address] [place] [address] [phone]          No Known Allergies  Consultations:  Orthopedics  Procedures/Studies: CT PELVIS WO CONTRAST  Result Date: 03/22/2020 CLINICAL DATA:  Fracture. EXAM: CT PELVIS WITHOUT CONTRAST TECHNIQUE: Multidetector CT imaging of the pelvis was performed following the standard protocol without intravenous contrast. COMPARISON:  X-ray from same day there be stone the portion urinary. There is a probable stone in the dependent portion of the urinary bladder. FINDINGS: Urinary Tract: There is a stone in the dependent portion of the urinary bladder. Bowel:  Unremarkable visualized pelvic bowel loops. Vascular/Lymphatic: There are advanced vascular calcifications visualized arterial structures. Reproductive:  No mass or other significant abnormality Other:  None. Musculoskeletal: Again noted is an acute displaced impacted subcapital/transcervical fracture of the proximal left femur. The appearance of the fracture is favored to be secondary to impaction as opposed to with underlying soft  tissue lytic lesion. There are no lytic lesions identified in the remaining osseous structures. IMPRESSION: 1. Acute impacted fracture of the proximal left femur as detailed above. No convincing evidence for an underlying lytic soft tissue lesion. 2. Probable small bladder stone. 3.  Aortic Atherosclerosis (ICD10-I70.0). Electronically Signed   By: Katherine Mantle M.D.   On: 03/22/2020 18:29   DG Pelvis Portable  Result Date: 03/23/2020 CLINICAL DATA:  Status post hip arthroplasty EXAM: PORTABLE PELVIS 1-2 VIEWS COMPARISON:  Mar 23, 2020 intraoperative images; pelvis and left hip radiographs Mar 22, 2020 FINDINGS: Frontal pelvis image obtained. There is a total hip replacement on the left with prosthetic components well-seated on frontal view. No acute fracture or dislocation evident postoperative. Slight narrowing right hip joint. 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 No acute fracture or dislocation evident currently. Slight narrowing right hip joint. Electronically Signed   By: Bretta Bang III M.D.   On: 03/23/2020 10:15   DG Knee Complete 4 Views Left  Result Date: 03/22/2020 CLINICAL DATA:  Pt fractured her left hip from a fall last night. Denies left knee pain. EXAM: LEFT KNEE - COMPLETE 4+ VIEW COMPARISON:  None. FINDINGS: No evidence of fracture, dislocation, or joint effusion. No evidence of arthropathy or other focal bone abnormality. There are vascular calcifications in the regional soft tissues. IMPRESSION: Negative radiographs of the left knee. Electronically Signed   By: Emmaline Kluver M.D.   On: 03/22/2020 18:33   DG C-Arm 1-60 Min  Result Date: 03/23/2020 CLINICAL DATA:  Hip replacement EXAM: DG C-ARM 1-60 MIN; OPERATIVE LEFT HIP WITH PELVIS FLUOROSCOPY TIME:  Fluoroscopy Time:  12 seconds COMPARISON:  None. FINDINGS: Frontal intraoperative radiographs demonstrate left total hip arthroplasty with  acetabular screw. Standard appearance on this single view. IMPRESSION: Fluoroscopic guidance for left hip arthroplasty. Electronically Signed   By: Guadlupe Spanish M.D.   On: 03/23/2020 09:35   DG HIP OPERATIVE UNILAT WITH PELVIS LEFT  Result Date: 03/23/2020 CLINICAL DATA:  Hip replacement EXAM: DG C-ARM 1-60 MIN; OPERATIVE LEFT HIP WITH PELVIS FLUOROSCOPY TIME:  Fluoroscopy Time:  12 seconds COMPARISON:  None. FINDINGS: Frontal intraoperative radiographs demonstrate left total hip arthroplasty with acetabular screw. Standard appearance on this single view. IMPRESSION: Fluoroscopic guidance for left hip arthroplasty. Electronically Signed   By: Guadlupe Spanish M.D.   On: 03/23/2020 09:35   DG Hip  Unilat W or Wo Pelvis 2-3 Views Left  Result Date: 03/22/2020 CLINICAL DATA:  Hip pain. EXAM: DG HIP (WITH OR WITHOUT PELVIS) 2-3V LEFT COMPARISON:  None. FINDINGS: There is an acute displaced subcapital fracture of the proximal left femur. There is no dislocation. The fracture plane appears irregular. There are degenerative changes of both hips. IMPRESSION: Acute displaced subcapital fracture of the proximal left femur. The fracture plane appears irregular which may be secondary to impaction. However, an underlying lytic lesion should be considered and can be further evaluated with cross-sectional imaging as clinically indicated. Electronically Signed   By: Katherine Mantle M.D.   On: 03/22/2020 15:34      Subjective: Pain in hip is "sore" and improved with po medications, tolerating mobility with PT. Wants to go home. No bleeding externally, no dyspnea or chest pain.  Discharge Exam: Vitals:   03/24/20 2152 03/25/20 0700  BP: (!) 141/66 (!) 142/67  Pulse: 98 94  Resp: 19 20  Temp: 99.1 F (37.3 C) 98.8 F (37.1 C)  SpO2: 95% 92%   General: Pt is alert, awake, not in acute distress Cardiovascular: RRR, S1/S2 +, no rubs, no gallops Respiratory: CTA bilaterally, no wheezing, no  rhonchi Abdominal: Soft, NT, ND, bowel sounds + Extremities: No edema, LLE with intact sensation and motor function distally, tenderness to palpation at surgical site without surrounding erythema or ecchymosis. Stocking-glove distribution of decreased sensation is stable.  Labs: BNP (last 3 results) No results for input(s): BNP in the last 8760 hours. Basic Metabolic Panel: Recent Labs  Lab 03/22/20 1419 03/23/20 0445 03/23/20 0728 03/24/20 0418 03/25/20 0354  NA 122* 126* 129* 129* 126*  K 3.8 3.7 3.7 4.0 4.3  CL 85* 92* 91* 97* 94*  CO2 25 25  --  23 25  GLUCOSE 257* 224* 209* 190* 272*  BUN 9 8 6 9 10   CREATININE 0.52 0.79 0.70 0.61 0.66  CALCIUM 9.3 8.2*  --  7.7* 7.9*   Liver Function Tests: Recent Labs  Lab 03/22/20 1419  AST 34  ALT 30  ALKPHOS 79  BILITOT 0.7  PROT 8.5*  ALBUMIN 4.6   No results for input(s): LIPASE, AMYLASE in the last 168 hours. No results for input(s): AMMONIA in the last 168 hours. CBC: Recent Labs  Lab 03/22/20 1419 03/23/20 0445 03/23/20 0728 03/24/20 0418 03/25/20 0354  WBC 15.2* 13.0*  --  9.9 9.0  NEUTROABS 13.1*  --   --   --   --   HGB 14.6 12.6 11.2* 8.7* 8.3*  HCT 41.4 36.9 33.0* 25.7* 24.8*  MCV 94.1 95.8  --  98.1 97.6  PLT 305 237  --  157 143*   Cardiac Enzymes: Recent Labs  Lab 03/22/20 1419  CKTOTAL 258*   BNP: Invalid input(s): POCBNP CBG: Recent Labs  Lab 03/24/20 1135 03/24/20 1627 03/24/20 2126 03/25/20 0757 03/25/20 1155  GLUCAP 251* 185* 252* 253* 264*   D-Dimer No results for input(s): DDIMER in the last 72 hours. Hgb A1c No results for input(s): HGBA1C in the last 72 hours. Lipid Profile No results for input(s): CHOL, HDL, LDLCALC, TRIG, CHOLHDL, LDLDIRECT in the last 72 hours. Thyroid function studies No results for input(s): TSH, T4TOTAL, T3FREE, THYROIDAB in the last 72 hours.  Invalid input(s): FREET3 Anemia work up No results for input(s): VITAMINB12, FOLATE, FERRITIN, TIBC, IRON,  RETICCTPCT in the last 72 hours. Urinalysis    Component Value Date/Time   COLORURINE Yellow 03/21/2014 1209   APPEARANCEUR Clear  03/21/2014 1209   LABSPEC 1.015 03/21/2014 1209   PHURINE 5.0 03/21/2014 1209   GLUCOSEU >=500 03/21/2014 1209   HGBUR Negative 03/21/2014 1209   BILIRUBINUR negative 12/25/2018 1413   BILIRUBINUR Negative 03/21/2014 1209   KETONESUR negative 12/25/2018 1413   KETONESUR Negative 03/21/2014 1209   PROTEINUR Negative 03/21/2014 1209   UROBILINOGEN 0.2 12/25/2018 1413   NITRITE Negative 12/25/2018 1413   NITRITE Negative 03/21/2014 1209   LEUKOCYTESUR Negative 12/25/2018 1413   LEUKOCYTESUR Negative 03/21/2014 1209    Microbiology Recent Results (from the past 240 hour(s))  SARS Coronavirus 2 by RT PCR (hospital order, performed in [facility] hospital lab) Nasopharyngeal Nasopharyngeal Swab     Status: None   Collection Time: 03/22/20  4:52 PM   Specimen: Nasopharyngeal Swab  Result Value Ref Range Status   SARS Coronavirus 2 NEGATIVE NEGATIVE Final    Comment: (NOTE) SARS-CoV-2 target nucleic acids are NOT DETECTED. The SARS-CoV-2 RNA is generally detectable in upper and lower respiratory specimens during the acute phase of infection. The lowest concentration of SARS-CoV-2 viral copies this assay can detect is 250 copies / mL. A negative result does not preclude SARS-CoV-2 infection and should not be used as the sole basis for treatment or other patient management decisions.  A negative result may occur with improper specimen collection / handling, submission of specimen other than nasopharyngeal swab, presence of viral mutation(s) within the areas targeted by this assay, and inadequate number of viral copies (<250 copies / mL). A negative result must be combined with clinical observations, patient history, and epidemiological information. Fact Sheet for Patients:   StrictlyIdeas.no Fact Sheet for Healthcare  Providers: BankingDealers.co.za This test is not yet approved or cleared  by the [place] FDA and has been authorized for detection and/or diagnosis of SARS-CoV-2 by FDA under an Emergency Use Authorization (EUA).  This EUA will remain in effect (meaning this test can be used) for the duration of the COVID-19 declaration under Section 564(b)(1) of the Act, 21 U.S.C. section 360bbb-3(b)(1), unless the authorization is terminated or revoked sooner. Performed at [facility], [place] [address] [place], [place] [address]   MRSA PCR Screening     Status: None   Collection Time: 03/23/20 12:50 AM   Specimen: Nasal Mucosa; Nasopharyngeal  Result Value Ref Range Status   MRSA by PCR NEGATIVE NEGATIVE Final    Comment:        The GeneXpert MRSA Assay (FDA approved for NASAL specimens only), is one component of a comprehensive MRSA colonization surveillance program. It is not intended to diagnose MRSA infection nor to guide or monitor treatment for MRSA infections. Performed at [facility], [place] [address] [place], [place] [address]     Time coordinating discharge: Approximately 40 minutes  Patrecia Pour, MD  [facility] 03/25/2020, 5:10 PM

## 2020-03-25 NOTE — Progress Notes (Signed)
Discharge instructions discussed with patient, verbalized agreement and understanding 

## 2020-03-25 NOTE — Progress Notes (Signed)
Physical Therapy Treatment Patient Details Name: Ashley Mcgee MRN: [id number] DOB: [birth year] Today's Date: 03/25/2020    History of Present Illness 49 y.o. female with a history of alcohol abuse, poorly-controlled T2DM with diabetic neuropathy, HTN, HLD, tobacco use who presented to the ED from home after a fall last night with left hip pain.  imaging revealed. Displaced left femoral neck fracture.Pt is s/p L DA THA per Dr. Charlann Boxer on 03/23/20    PT Comments    Pt in room crying upon PT arrival.  Pt stated she was crying because her pain was so bad. RN notified, gave meds during session. Pt able to perform ankle pumps, unable to amb or complete HEP at this time d/t pain level. Encouraged breathing, repositioned in bed,  Applied Ice to hip and pt reported feeling better. Will attempt again this pm.   Follow Up Recommendations  Home health PT     Equipment Recommendations  Rolling walker with 5" wheels;3in1 (PT)    Recommendations for Other Services       Precautions / Restrictions Precautions Precautions: Fall Restrictions LLE Weight Bearing: Weight bearing as tolerated    Mobility  Bed Mobility                  Transfers                    Ambulation/Gait                 Stairs             Wheelchair Mobility    Modified Rankin (Stroke Patients Only)       Balance                                            Cognition Arousal/Alertness: Awake/alert Behavior During Therapy: WFL for tasks assessed/performed Overall Cognitive Status: Within Functional Limits for tasks assessed                                        Exercises Total Joint Exercises Ankle Circles/Pumps: AROM;Both;20 reps Quad Sets: Limitations Quad Sets Limitations: pain    General Comments        Pertinent Vitals/Pain Pain Assessment: Faces Faces Pain Scale: Hurts worst Pain Location: left LE Pain Descriptors / Indicators:  Crying;Grimacing Pain Intervention(s): Limited activity within patient's tolerance;Monitored during session;Ice applied;Patient requesting pain meds-RN notified;RN gave pain meds during session;Relaxation    Home Living                      Prior Function            PT Goals (current goals can now be found in the care plan section) Acute Rehab PT Goals Patient Stated Goal: home PT Goal Formulation: With patient Time For Goal Achievement: 03/31/20 Potential to Achieve Goals: Good Progress towards PT goals: Progressing toward goals    Frequency    7X/week      PT Plan Current plan remains appropriate    Co-evaluation              AM-PAC PT "6 Clicks" Mobility   Outcome Measure  Help needed turning from your back to your side while in a flat bed without using bedrails?: A Little Help  needed moving from lying on your back to sitting on the side of a flat bed without using bedrails?: A Little Help needed moving to and from a bed to a chair (including a wheelchair)?: A Little Help needed standing up from a chair using your arms (e.g., wheelchair or bedside chair)?: A Little Help needed to walk in hospital room?: A Little Help needed climbing 3-5 steps with a railing? : A Lot 6 Click Score: 17    End of Session   Activity Tolerance: Patient limited by pain Patient left: in bed;with call bell/phone within reach;with bed alarm set   PT Visit Diagnosis: Difficulty in walking, not elsewhere classified (R26.2);History of falling (Z91.81)     Time: 1103-1594 PT Time Calculation (min) (ACUTE ONLY): 21 min  Charges:  $Self Care/Home Management: 8-22                     Baxter Flattery, PT   Acute Rehab Dept [facility]): [phone]   03/25/2020    [facility] 03/25/2020, 12:28 PM

## 2020-03-25 NOTE — Progress Notes (Signed)
Physical Therapy Treatment Patient Details Name: Ashley Mcgee MRN: [id number] DOB: 11-17-1970 Today's Date: 03/25/2020    History of Present Illness 49 y.o. female with a history of alcohol abuse, poorly-controlled T2DM with diabetic neuropathy, HTN, HLD, tobacco use who presented to the ED from home after a fall last night with left hip pain.  imaging revealed. Displaced left femoral neck fracture.Pt is s/p L DA THA per Dr. Charlann Boxer on 03/23/20    PT Comments    Pt progressing toward goals, continues to be at risk for falls, will need HHPT. Pt was able to amb incr distance this pm with cues for safety and correct technique with RW.  Again pt declined OT however gave her AE and she was appreciative, briefly reviewed the purpose for AE items and pt verbalizes understanding; reviewed HEP handout, use of ice and how to use ice packs.    Follow Up Recommendations  Home health PT;Supervision - Intermittent;Supervision for mobility/OOB     Equipment Recommendations  Rolling walker with 5" wheels;3in1 (PT)    Recommendations for Other Services       Precautions / Restrictions Precautions Precautions: Fall Restrictions LLE Weight Bearing: Weight bearing as tolerated    Mobility  Bed Mobility   Bed Mobility: Supine to Sit     Supine to sit: Supervision;HOB elevated     General bed mobility comments: incr time and effort, pt is able to self assist with gait belt as leg lifter  Transfers Overall transfer level: Needs assistance Equipment used: Rolling walker (2 wheeled) Transfers: Sit to/from Stand Sit to Stand: Min guard;Supervision         General transfer comment: cues for hand placement and LLE position  Ambulation/Gait Ambulation/Gait assistance: Min guard;Supervision Gait Distance (Feet): 60 Feet Assistive device: Rolling walker (2 wheeled) Gait Pattern/deviations: Step-to pattern;Decreased stance time - left;Decreased step length - right;Decreased step length -  left;Wide base of support     General Gait Details: able to advance LLE without UE assist today, cues for RW safety, to keep both feet inside RW    Stairs             Wheelchair Mobility    Modified Rankin (Stroke Patients Only)       Balance             Standing balance-Leahy Scale: Fair Standing balance comment: able to maintain static stand without support, reliant on UEs for dynamic tasks                             Cognition Arousal/Alertness: Awake/alert Behavior During Therapy: WFL for tasks assessed/performed Overall Cognitive Status: Within Functional Limits for tasks assessed                                        Exercises Total Joint Exercises Ankle Circles/Pumps: AROM;Both;20 reps Quad Sets: Limitations Quad Sets Limitations: pain    General Comments        Pertinent Vitals/Pain Pain Assessment: Faces Faces Pain Scale: Hurts little more Pain Location: left LE Pain Descriptors / Indicators: Sore Pain Intervention(s): Limited activity within patient's tolerance;Monitored during session;Premedicated before session    Home Living                      Prior Function  PT Goals (current goals can now be found in the care plan section) Acute Rehab PT Goals Patient Stated Goal: home PT Goal Formulation: With patient Time For Goal Achievement: 03/31/20 Potential to Achieve Goals: Good Progress towards PT goals: Progressing toward goals    Frequency    7X/week      PT Plan Current plan remains appropriate    Co-evaluation              AM-PAC PT "6 Clicks" Mobility   Outcome Measure  Help needed turning from your back to your side while in a flat bed without using bedrails?: A Little Help needed moving from lying on your back to sitting on the side of a flat bed without using bedrails?: A Little Help needed moving to and from a bed to a chair (including a wheelchair)?: A  Little Help needed standing up from a chair using your arms (e.g., wheelchair or bedside chair)?: A Little Help needed to walk in hospital room?: A Little Help needed climbing 3-5 steps with a railing? : A Lot 6 Click Score: 14    End of Session   Activity Tolerance: Patient tolerated treatment well Patient left: in bed;with call bell/phone within reach;with bed alarm set   PT Visit Diagnosis: Difficulty in walking, not elsewhere classified (R26.2);History of falling (Z91.81)     Time: 2694-8546 PT Time Calculation (min) (ACUTE ONLY): 24 min  Charges:  $Gait Training: 8-22 mins $Therapeutic Activity: 8-22 mins $Self Care/Home Management: 8-22                     Baxter Flattery, PT   Acute Rehab Dept [facility]): [phone]   03/25/2020    [facility] 03/25/2020, 3:47 PM

## 2020-03-25 NOTE — TOC Transition Note (Signed)
Transition of Care [facility]) - CM/SW Discharge Note   Patient Details  Name: Ashley Mcgee MRN: [id number] Date of Birth: 1971-01-21  Transition of Care [facility]) CM/SW Contact:  Lennart Pall, LCSW Phone Number: 03/25/2020, 10:53 AM   Clinical Narrative:   Met with pt yesterday and today to discuss d/c needs.  Pt with likely d/c today.  She does live alone, however, in a handicap accessible apartment community supported by [employer].  Has good support from neighbors and denies any concerns about return home.  DC arrangements in place - see below.  No further TOC needs at this time.    Final next level of care: [place] Barriers to Discharge: Barriers Resolved   Patient Goals and CMS Choice Patient states their goals for this hospitalization and ongoing recovery are:: to go home today if ready CMS Medicare.gov Compare Post Acute Care list provided to:: Patient Choice offered to / list presented to : Patient  Discharge Placement                       Discharge Plan and Services                DME Arranged: 3-N-1, Walker rolling DME Agency: [employer] Date DME Agency Contacted: 03/24/20 Time DME Agency Contacted: 0100 Representative spoke with at DME Agency: [place]: PT, OT [place] Agency: [employer] (now [facility]) Date [place]: 03/25/20 Time [place]: 1053 Representative spoke with at [place]: [place] ([place]) Interventions     Readmission Risk Interventions No flowsheet data found.

## 2020-03-25 NOTE — Progress Notes (Signed)
   Subjective: 2 Days Post-Op Procedure(s) (LRB): TOTAL HIP ARTHROPLASTY ANTERIOR APPROACH (Left) Patient reports pain as mild.   Patient seen in rounds for Dr. Charlann Boxer. Patient is well, and has had no acute complaints or problems. No acute events overnight. Denies CP, SHOB, N/V. Voiding without difficulty, positive flatus. Ambulated 20 feet with PT yesterday.  We will continue therapy today.   Objective: Vital signs in last 24 hours: Temp:  [98.4 F (36.9 C)-99.1 F (37.3 C)] 98.8 F (37.1 C) (06/01 0700) Pulse Rate:  [88-98] 94 (06/01 0700) Resp:  [16-20] 20 (06/01 0700) BP: (133-142)/(64-71) 142/67 (06/01 0700) SpO2:  [92 %-100 %] 92 % (06/01 0700)  Intake/Output from previous day:  Intake/Output Summary (Last 24 hours) at 03/25/2020 0709 Last data filed at 03/25/2020 0600 Gross per 24 hour  Intake 1020 ml  Output 300 ml  Net 720 ml     Intake/Output this shift: No intake/output data recorded.  Labs: Recent Labs    03/22/20 1419 03/23/20 0445 03/23/20 0728 03/24/20 0418 03/25/20 0354  HGB 14.6 12.6 11.2* 8.7* 8.3*   Recent Labs    03/24/20 0418 03/25/20 0354  WBC 9.9 9.0  RBC 2.62* 2.54*  HCT 25.7* 24.8*  PLT 157 143*   Recent Labs    03/24/20 0418 03/25/20 0354  NA 129* 126*  K 4.0 4.3  CL 97* 94*  CO2 23 25  BUN 9 10  CREATININE 0.61 0.66  GLUCOSE 190* 272*  CALCIUM 7.7* 7.9*   Recent Labs    03/23/20 0445  INR 1.1    Exam: General - Patient is Alert and Oriented Extremity - Neurologically intact Sensation intact distally Intact pulses distally Dorsiflexion/Plantar flexion intact Dressing - dressing C/D/I Motor Function - intact, moving foot and toes well on exam.   Past Medical History:  Diagnosis Date  . Alcohol dependence in remission (HCC)   . Diabetic peripheral neuropathy (HCC)   . Hyperlipidemia   . Tobacco dependence   . Type 2 diabetes with complication (HCC)     Assessment/Plan: 2 Days Post-Op Procedure(s) (LRB): TOTAL  HIP ARTHROPLASTY ANTERIOR APPROACH (Left) Principal Problem:   Closed left hip fracture (HCC) Active Problems:   Alcohol dependence in remission (HCC)   Hyperlipidemia   Diabetic peripheral neuropathy (HCC)   Tobacco dependence   Type 2 diabetes with complication (HCC)   Hyponatremia  Estimated body mass index is 30.49 kg/m as calculated from the following:   Height as of this encounter: 5\' 10"  (1.778 m).   Weight as of this encounter: 96.4 kg. Advance diet Up with therapy  DVT Prophylaxis - Aspirin Weight bearing as tolerated.  Hemoglobin of 8.3 this morning, secondary to acute post-operative blood loss anemia. Plan is to go Home after hospital stay. She will continue working with PT today. She will be ready for discharge from our standpoint when she is meeting her goals with therapy and found to be medically stable for discharge. Aquacel to remain in place until follow up. Follow up in 2 weeks.   , PA-C Orthopedic Surgery [phone] 03/25/2020, 7:09 AM

## 2020-03-27 ENCOUNTER — Telehealth: Payer: Self-pay

## 2020-03-27 NOTE — Telephone Encounter (Signed)
Transition Care Management Follow-up Telephone Call Date of discharge and from where: 03/25/2020 Ashley Mcgee How have you been since you were released from the hospital? Feeling better  Any questions or concerns? None  Items Reviewed: Did the pt receive and understand the discharge instructions provided? YES Medications obtained and verified? YES  Any new allergies since your discharge? NONE Dietary orders reviewed? Yes reviewed with the patient  Do you have support at home? Yes family   Functional Questionnaire: (I = Independent and D = Dependent) ADLs: I with assistance of a rolling walker   Follow up appointments reviewed:  PCP Hospital f/u appt confirmed?  Scheduled to see Rose Phi Clung  on  04/03/2020     Specialist Hospital f/u appt confirmed? Not yet   Are transportation arrangements needed? NO  If their condition worsens, /is the pt aware to call PCP or go to the Emergency Dept.?  Pt is aware if condition is worsening or start experiencing any of diff breathing, SOB, chest pain, extreme fatigue,  Persistent nausea and vomiting, bleeding , rapid weight gain, severe uncontrolled pain, or visual disturbances to return to ED  Was the patient provided with contact information for the PCP's office or ED? YES given.  Was to pt encouraged to call back with questions or concerns?YES name and contact information .  OTHER DME  [facility] to provide physical and occupational therapy Stressed  attention on diabetes management. and the need to keep Follow up with orthopedics 2 weeks. Verbalized understanding

## 2020-04-02 NOTE — Progress Notes (Signed)
Patient ID: Ashley Mcgee, female   DOB: January 30, 1971, 49 y.o.   MRN: [id number]     Ashley Mcgee, is a 49 y.o. female  QVZ:[id number]  PIR:[id number]  DOB - [birth year]  Subjective:  Chief Complaint and HPI: Ashley Mcgee is a 49 y.o. female here today for a follow up visit After hospitalization 5/29-03/25/2020 subsequent to a fall and hip fracture.  She is supposed to f/up with Dr Charlann Boxer but has not made an appt.  She is not taking any of her diabetes meds.  Has not had alcohol since injury  From discharge summary: Recommendations for Outpatient Follow-up:  1. Follow up with PCP in 1-2 weeks with attention on diabetes management.  2. Repeat CBC at follow up 3. Follow up with orthopedics 2 weeks  Home Health: PT, OT Equipment/Devices: 3 in 1, rolling walker Discharge Condition: Stable CODE STATUS: Full Diet recommendation: Carb-modified  Brief/Interim Summary: Ashley Parrishis a 49 y.o.femalewith a history ofalcohol abuse, poorly-controlled T2DM with diabetic neuropathy, HTN, HLD, and tobacco use who presented to the ED 5/29 after a fall at home the previous evening found to have displaced left hip fracture. She was admitted and taken for THA on 5/30. Postoperative course complicated by asymptomatic acute blood loss anemia not requiring transfusion.   Discharge Diagnoses:  Principal Problem:   Closed left hip fracture (HCC) Active Problems:   Alcohol dependence in remission (HCC)   Hyperlipidemia   Diabetic peripheral neuropathy (HCC)   Tobacco dependence   Type 2 diabetes with complication (HCC)   Hyponatremia  Left hip fracture due to fall at home: s/p anterior THA by Dr. Charlann Boxer 5/30. Note CT showed no evidence of underlying lytic lesion.  - PT/OT at home,WBAT per ortho.  - ASA 81mg  po BID per orthopedics for DVT ppx - Pain control adequate. PDMP reviewed, no relevant prescriptions. Given hydrocodone for 5 day supply as below (prescribed electronically) -  Outpatient f/u with Dr. . Continue aquacel until that time.  Acute blood loss anemia:  - Monitor CBC at follow up. No external or GI bleeding noted. Hemodynamically stable.  T2DM with diabetic neuropathy uncontrolled with hyperglycemia:  - Continue metformin (has not tolerated 1g BID, so Rx 500mg  BID) and follow up with PCP soon - Restarted gabapentin at lower dose as she's not been taking this regularly.  - States she's not taking lantus as it is on her MAR, though based on inpatient values, 12u lantus would be a good start for management as outpatient.   Hyponatremia: Exaggerated by hyperglycemia, improvedwith isotonic saline at an acceptable rate, corrects to 129mg /dl.  - Monitor BMP, consider further work up if persistent.  HLD:  - Pt not taking statin  HTN:  - Pt not taking lisinopril  Tobacco use: ~1ppd - Nicotine patch while admitted, cessation counseling provided.  History of alcohol abuse, current alcohol use: - EtOH level was not checked at arrival, but at 7pm on the day of admission it was still detectable, so suspect intoxication likely played a roll in fall - No evidence of withdrawal throughout hospitalization   ED/Hospital notes reviewed.    ROS:   Constitutional:  No f/c, No night sweats, No unexplained weight loss. EENT:  No vision changes, No blurry vision, No hearing changes. No mouth, throat, or ear problems.  Respiratory: No cough, No SOB Cardiac: No CP, no palpitations GI:  No abd pain, No N/V/D. GU: No Urinary s/sx Musculoskeletal: hip pain Neuro: No headache, no dizziness, no motor weakness.  Skin: No  rash Endocrine:  No polydipsia. No polyuria.  Psych: Denies SI/HI  No problems updated.  ALLERGIES: No Known Allergies  PAST MEDICAL HISTORY: Past Medical History:  Diagnosis Date  . Alcohol dependence in remission ([place])   . Diabetic peripheral neuropathy ([place])   . Hyperlipidemia   . Tobacco dependence   . Type 2 diabetes with  complication ([place])     MEDICATIONS AT HOME: Prior to Admission medications   Medication Sig Start Date End Date Taking? Authorizing Provider  glucose blood (ACCU-CHEK AVIVA) test strip Use as instructed to check blood sugar 2-3 times per day 07/14/18  Yes Fulp, Cammie, MD  ibuprofen (ADVIL) 200 MG tablet Take 200 mg by mouth every 6 (six) hours as needed for headache.   Yes [provider]  Lancets (ACCU-CHEK SOFT TOUCH) lancets Use as instructed 07/14/18  Yes Fulp, Cammie, MD  lisinopril (ZESTRIL) 2.5 MG tablet Take 1 tablet (2.5 mg total) by mouth daily. 04/03/20  Yes Argentina Donovan, PA-C  aspirin 81 MG chewable tablet Chew 1 tablet (81 mg total) by mouth 2 (two) times daily. Patient not taking: Reported on 04/03/2020 03/25/20   Ashley Pour, MD  divalproex (DEPAKOTE) 500 MG DR tablet Take 500 mg by mouth 2 (two) times daily. Patient not taking: Reported on 04/03/2020    [provider]  gabapentin (NEURONTIN) 300 MG capsule Take 1 capsule (300 mg total) by mouth 3 (three) times daily. For nerve pain 04/03/20   Freeman Caldron M, PA-C  insulin glargine (LANTUS SOLOSTAR) 100 UNIT/ML Solostar Pen Inject 12 Units into the skin daily. 04/03/20   Argentina Donovan, PA-C  Insulin Pen Needle (TRUEPLUS PEN NEEDLES) 32G X 4 MM MISC Use to inject insulin daily. 04/03/20   Argentina Donovan, PA-C  metFORMIN (GLUCOPHAGE) 500 MG tablet Take 1 tablet (500 mg total) by mouth 2 (two) times daily with a meal. 04/03/20   Ashley Mcgee, Dionne Bucy, PA-C  rosuvastatin (CRESTOR) 20 MG tablet Take 1 tablet (20 mg total) by mouth daily. To lower cholesterol 04/03/20   Argentina Donovan, PA-C     Objective:  EXAM:   Vitals:   04/03/20 1428  BP: (!) 150/78  Pulse: 87  Resp: 16  Temp: 97.6 F (36.4 C)  SpO2: 100%  Weight: 221 lb (100.2 kg)  Height: 5\' 10"  (1.778 m)    General appearance : A&OX3. NAD. Non-toxic-appearing;  Using a walker.   HEENT: Atraumatic and Normocephalic.  PERRLA. EOM intact.     Chest/Lungs:  Breathing-non-labored, Good air entry bilaterally, breath sounds normal without rales, rhonchi, or wheezing  CVS: S1 S2 regular, no murmurs, gallops, rubs. Extremities: Bilateral Lower Ext shows no edema, both legs are warm to touch with = pulse throughout Neurology:  CN II-XII grossly intact, Non focal.   Psych:  TP linear. J/I poor. Kind of grunts speech. Plays a game on her phone for entirety of visit and flat affect.  Skin:  No Rash  Data Review Lab Results  Component Value Date   HGBA1C 11.3 (A) 04/11/2019   HGBA1C 9.9 (A) 11/13/2018   HGBA1C 5.6 07/14/2018     Assessment & Plan   1. Poorly controlled type 2 diabetes mellitus with peripheral neuropathy ([place]) Uncontrolled-not taking any meds.  Resume metformin and lantus and check blood sugars at least bid and record.   - Glucose (CBG) - CBC with Differential/Platelet - metFORMIN (GLUCOPHAGE) 500 MG tablet; Take 1 tablet (500 mg total) by mouth 2 (two) times daily  with a meal.  Dispense: 180 tablet; Refill: 3 - insulin glargine (LANTUS SOLOSTAR) 100 UNIT/ML Solostar Pen; Inject 12 Units into the skin daily.  Dispense: 5 pen; Refill: 4 - gabapentin (NEURONTIN) 300 MG capsule; Take 1 capsule (300 mg total) by mouth 3 (three) times daily. For nerve pain  Dispense: 90 capsule; Refill: 0 - insulin aspart (novoLOG) injection 15 Units  2. Essential hypertension Not at goal-resume lisinopril - CBC with Differential/Platelet - Basic metabolic panel - lisinopril (ZESTRIL) 2.5 MG tablet; Take 1 tablet (2.5 mg total) by mouth daily.  Dispense: 90 tablet; Refill: 1  3. Alcohol dependence in remission (HCC) I have counseled the patient at length about substance abuse and addiction.  12 step meetings/recovery recommended.  Local 12 step meeting lists were given and attendance was encouraged.  Patient expresses understanding.    4. Closed fracture of left hip, initial encounter [facility]) Make appt with Dr Charlann Boxer.  Information  given  5. Diabetic peripheral neuropathy (HCC) - gabapentin (NEURONTIN) 300 MG capsule; Take 1 capsule (300 mg total) by mouth 3 (three) times daily. For nerve pain  Dispense: 90 capsule; Refill: 0  6. Tobacco use Smoking and dangers of nicotine have been discussed at length. Long term health consequences of smoking reviewed in detail.  Methods for helping with cessation have been reviewed.  Patient expresses understanding.   7. Hyponatremia - CBC with Differential/Platelet - Basic metabolic panel  8. Hospital discharge follow-up  9. Mixed hyperlipidemia due to type 2 diabetes mellitus (HCC) - rosuvastatin (CRESTOR) 20 MG tablet; Take 1 tablet (20 mg total) by mouth daily. To lower cholesterol  Dispense: 30 tablet; Refill: 6  10. Non-compliance Compliance imperative.  Patient seems to have little care or concern and I worry that she will not adhere to her regimen   Patient have been counseled extensively about nutrition and exercise  Return in about 1 month (around 05/03/2020) for with her PCP for chronic conditions.  .  The patient was given clear instructions to go to ER or return to medical center if symptoms don't improve, worsen or new problems develop. The patient verbalized understanding. The patient was told to call to get lab results if they haven't heard anything in the next week.     Georgian Co, PA-C [facility] [place], [place] [phone]   04/03/2020, 3:00 PM

## 2020-04-03 ENCOUNTER — Ambulatory Visit: Payer: Medicaid Other | Attending: Family Medicine | Admitting: Physician Assistant

## 2020-04-03 ENCOUNTER — Other Ambulatory Visit: Payer: Self-pay

## 2020-04-03 VITALS — BP 150/78 | HR 87 | Temp 97.6°F | Resp 16 | Ht 70.0 in | Wt 221.0 lb

## 2020-04-03 DIAGNOSIS — Z96642 Presence of left artificial hip joint: Secondary | ICD-10-CM | POA: Insufficient documentation

## 2020-04-03 DIAGNOSIS — F1021 Alcohol dependence, in remission: Secondary | ICD-10-CM | POA: Diagnosis not present

## 2020-04-03 DIAGNOSIS — E871 Hypo-osmolality and hyponatremia: Secondary | ICD-10-CM

## 2020-04-03 DIAGNOSIS — Z7982 Long term (current) use of aspirin: Secondary | ICD-10-CM | POA: Diagnosis not present

## 2020-04-03 DIAGNOSIS — I10 Essential (primary) hypertension: Secondary | ICD-10-CM | POA: Diagnosis not present

## 2020-04-03 DIAGNOSIS — E1165 Type 2 diabetes mellitus with hyperglycemia: Secondary | ICD-10-CM | POA: Diagnosis not present

## 2020-04-03 DIAGNOSIS — Z91199 Patient's noncompliance with other medical treatment and regimen due to unspecified reason: Secondary | ICD-10-CM

## 2020-04-03 DIAGNOSIS — Z794 Long term (current) use of insulin: Secondary | ICD-10-CM | POA: Insufficient documentation

## 2020-04-03 DIAGNOSIS — Z791 Long term (current) use of non-steroidal anti-inflammatories (NSAID): Secondary | ICD-10-CM | POA: Diagnosis not present

## 2020-04-03 DIAGNOSIS — S72002A Fracture of unspecified part of neck of left femur, initial encounter for closed fracture: Secondary | ICD-10-CM | POA: Diagnosis not present

## 2020-04-03 DIAGNOSIS — D62 Acute posthemorrhagic anemia: Secondary | ICD-10-CM | POA: Diagnosis not present

## 2020-04-03 DIAGNOSIS — E1169 Type 2 diabetes mellitus with other specified complication: Secondary | ICD-10-CM | POA: Diagnosis not present

## 2020-04-03 DIAGNOSIS — Z79899 Other long term (current) drug therapy: Secondary | ICD-10-CM | POA: Insufficient documentation

## 2020-04-03 DIAGNOSIS — E1142 Type 2 diabetes mellitus with diabetic polyneuropathy: Secondary | ICD-10-CM | POA: Diagnosis present

## 2020-04-03 DIAGNOSIS — Z720 Tobacco use: Secondary | ICD-10-CM

## 2020-04-03 DIAGNOSIS — E782 Mixed hyperlipidemia: Secondary | ICD-10-CM

## 2020-04-03 DIAGNOSIS — W19XXXA Unspecified fall, initial encounter: Secondary | ICD-10-CM | POA: Insufficient documentation

## 2020-04-03 DIAGNOSIS — F17210 Nicotine dependence, cigarettes, uncomplicated: Secondary | ICD-10-CM | POA: Insufficient documentation

## 2020-04-03 DIAGNOSIS — Z09 Encounter for follow-up examination after completed treatment for conditions other than malignant neoplasm: Secondary | ICD-10-CM

## 2020-04-03 DIAGNOSIS — Z9119 Patient's noncompliance with other medical treatment and regimen: Secondary | ICD-10-CM | POA: Diagnosis not present

## 2020-04-03 LAB — GLUCOSE, POCT (MANUAL RESULT ENTRY)
POC Glucose: 289 mg/dl — AB (ref 70–99)
POC Glucose: 301 mg/dl — AB (ref 70–99)

## 2020-04-03 MED ORDER — TRUEPLUS PEN NEEDLES 32G X 4 MM MISC: 11 refills | Status: DC

## 2020-04-03 MED ORDER — LISINOPRIL 2.5 MG PO TABS: 2.5000 mg | ORAL_TABLET | Freq: Every day | ORAL | 1 refills | Status: DC

## 2020-04-03 MED ORDER — LANTUS SOLOSTAR 100 UNIT/ML ~~LOC~~ SOPN: 12.0000 [IU] | PEN_INJECTOR | Freq: Every day | SUBCUTANEOUS | 4 refills | Status: DC

## 2020-04-03 MED ORDER — METFORMIN HCL 500 MG PO TABS: 500.0000 mg | ORAL_TABLET | Freq: Two times a day (BID) | ORAL | 3 refills | Status: DC

## 2020-04-03 MED ORDER — GABAPENTIN 300 MG PO CAPS: 300.0000 mg | ORAL_CAPSULE | Freq: Three times a day (TID) | ORAL | 0 refills | Status: DC

## 2020-04-03 MED ORDER — ROSUVASTATIN CALCIUM 20 MG PO TABS: 20.0000 mg | ORAL_TABLET | Freq: Every day | ORAL | 6 refills | Status: DC

## 2020-04-03 MED ORDER — INSULIN ASPART 100 UNIT/ML ~~LOC~~ SOLN
15.0000 [IU] | Freq: Once | SUBCUTANEOUS | Status: AC
  Administered 2020-04-03: 15 [IU] via SUBCUTANEOUS

## 2020-04-03 NOTE — Patient Instructions (Addendum)
[email or web address] is the website for alcoholics anonymous meetings if you find that you have a difficult time stopping/satying stopped drinking alcohol.    Check your blood suggars fasting and at bedtime and record and bring to your next visit.  Make an appti with Dr Charlann Boxer. [phone] [address] [place], [place] [address]

## 2020-04-03 NOTE — Progress Notes (Signed)
HFU  High PHQ and Gad 7 scores/ denies any thoughts of harming herself or other / has a therapist at [facility] the [facility]  Does not have a glucometer

## 2022-02-07 ENCOUNTER — Inpatient Hospital Stay (HOSPITAL_COMMUNITY)
Admission: EM | Admit: 2022-02-07 | Discharge: 2022-02-11 | DRG: 871 | Disposition: A | Discharge status: Home / Self Care | Payer: Medicaid Other | Attending: Internal Medicine | Admitting: Internal Medicine

## 2022-02-07 ENCOUNTER — Emergency Department (HOSPITAL_COMMUNITY): Payer: Medicaid Other

## 2022-02-07 ENCOUNTER — Other Ambulatory Visit: Payer: Self-pay

## 2022-02-07 DIAGNOSIS — Z79899 Other long term (current) drug therapy: Secondary | ICD-10-CM | POA: Diagnosis not present

## 2022-02-07 DIAGNOSIS — W19XXXA Unspecified fall, initial encounter: Secondary | ICD-10-CM | POA: Diagnosis present

## 2022-02-07 DIAGNOSIS — F1021 Alcohol dependence, in remission: Secondary | ICD-10-CM | POA: Diagnosis present

## 2022-02-07 DIAGNOSIS — J189 Pneumonia, unspecified organism: Secondary | ICD-10-CM | POA: Diagnosis present

## 2022-02-07 DIAGNOSIS — J9601 Acute respiratory failure with hypoxia: Secondary | ICD-10-CM | POA: Diagnosis present

## 2022-02-07 DIAGNOSIS — J918 Pleural effusion in other conditions classified elsewhere: Secondary | ICD-10-CM | POA: Diagnosis present

## 2022-02-07 DIAGNOSIS — Z7984 Long term (current) use of oral hypoglycemic drugs: Secondary | ICD-10-CM | POA: Diagnosis not present

## 2022-02-07 DIAGNOSIS — E1165 Type 2 diabetes mellitus with hyperglycemia: Secondary | ICD-10-CM

## 2022-02-07 DIAGNOSIS — J869 Pyothorax without fistula: Secondary | ICD-10-CM | POA: Diagnosis present

## 2022-02-07 DIAGNOSIS — J9819 Other pulmonary collapse: Secondary | ICD-10-CM | POA: Diagnosis present

## 2022-02-07 DIAGNOSIS — E871 Hypo-osmolality and hyponatremia: Secondary | ICD-10-CM | POA: Diagnosis present

## 2022-02-07 DIAGNOSIS — F17210 Nicotine dependence, cigarettes, uncomplicated: Secondary | ICD-10-CM | POA: Diagnosis present

## 2022-02-07 DIAGNOSIS — E1142 Type 2 diabetes mellitus with diabetic polyneuropathy: Secondary | ICD-10-CM | POA: Diagnosis present

## 2022-02-07 DIAGNOSIS — R0602 Shortness of breath: Secondary | ICD-10-CM | POA: Diagnosis present

## 2022-02-07 DIAGNOSIS — E118 Type 2 diabetes mellitus with unspecified complications: Secondary | ICD-10-CM | POA: Diagnosis not present

## 2022-02-07 DIAGNOSIS — A419 Sepsis, unspecified organism: Principal | ICD-10-CM | POA: Diagnosis present

## 2022-02-07 DIAGNOSIS — R739 Hyperglycemia, unspecified: Secondary | ICD-10-CM | POA: Diagnosis not present

## 2022-02-07 DIAGNOSIS — Z96642 Presence of left artificial hip joint: Secondary | ICD-10-CM | POA: Diagnosis present

## 2022-02-07 DIAGNOSIS — I10 Essential (primary) hypertension: Secondary | ICD-10-CM | POA: Diagnosis present

## 2022-02-07 DIAGNOSIS — E785 Hyperlipidemia, unspecified: Secondary | ICD-10-CM | POA: Diagnosis present

## 2022-02-07 DIAGNOSIS — Z91148 Patient's other noncompliance with medication regimen for other reason: Secondary | ICD-10-CM

## 2022-02-07 DIAGNOSIS — E8770 Fluid overload, unspecified: Secondary | ICD-10-CM | POA: Diagnosis present

## 2022-02-07 DIAGNOSIS — Z8249 Family history of ischemic heart disease and other diseases of the circulatory system: Secondary | ICD-10-CM

## 2022-02-07 DIAGNOSIS — N179 Acute kidney failure, unspecified: Secondary | ICD-10-CM | POA: Diagnosis present

## 2022-02-07 DIAGNOSIS — R6520 Severe sepsis without septic shock: Secondary | ICD-10-CM | POA: Diagnosis present

## 2022-02-07 DIAGNOSIS — E1110 Type 2 diabetes mellitus with ketoacidosis without coma: Secondary | ICD-10-CM | POA: Diagnosis present

## 2022-02-07 DIAGNOSIS — R000 Tachycardia, unspecified: Secondary | ICD-10-CM

## 2022-02-07 DIAGNOSIS — Z833 Family history of diabetes mellitus: Secondary | ICD-10-CM

## 2022-02-07 LAB — PROTIME-INR
INR: 1.3 — ABNORMAL HIGH (ref 0.8–1.2)
Prothrombin Time: 16.3 seconds — ABNORMAL HIGH (ref 11.4–15.2)

## 2022-02-07 LAB — BLOOD GAS, VENOUS
Acid-base deficit: 4.4 mmol/L — ABNORMAL HIGH (ref 0.0–2.0)
Acid-base deficit: 2.1 mmol/L — ABNORMAL HIGH (ref 0.0–2.0)
Bicarbonate: 20.2 mmol/L (ref 20.0–28.0)
Bicarbonate: 22.2 mmol/L (ref 20.0–28.0)
O2 Saturation: 55.9 %
O2 Saturation: 85.6 %
Patient temperature: 37
Patient temperature: 37.1
pCO2, Ven: 35 mmHg — ABNORMAL LOW (ref 44–60)
pCO2, Ven: 35 mmHg — ABNORMAL LOW (ref 44–60)
pH, Ven: 7.37 (ref 7.25–7.43)
pH, Ven: 7.41 (ref 7.25–7.43)
pO2, Ven: <31 mmHg — CL (ref 32–45)
pO2, Ven: 50 mmHg — ABNORMAL HIGH (ref 32–45)

## 2022-02-07 LAB — BASIC METABOLIC PANEL
Anion gap: 13 (ref 5–15)
Anion gap: 12 (ref 5–15)
Anion gap: 13 (ref 5–15)
BUN: 23 mg/dL — ABNORMAL HIGH (ref 6–20)
BUN: 23 mg/dL — ABNORMAL HIGH (ref 6–20)
BUN: 20 mg/dL (ref 6–20)
CO2: 19 mmol/L — ABNORMAL LOW (ref 22–32)
CO2: 22 mmol/L (ref 22–32)
CO2: 18 mmol/L — ABNORMAL LOW (ref 22–32)
Calcium: 8.1 mg/dL — ABNORMAL LOW (ref 8.9–10.3)
Calcium: 8 mg/dL — ABNORMAL LOW (ref 8.9–10.3)
Calcium: 7 mg/dL — ABNORMAL LOW (ref 8.9–10.3)
Chloride: 93 mmol/L — ABNORMAL LOW (ref 98–111)
Chloride: 93 mmol/L — ABNORMAL LOW (ref 98–111)
Chloride: 85 mmol/L — ABNORMAL LOW (ref 98–111)
Creatinine, Ser: 1.41 mg/dL — ABNORMAL HIGH (ref 0.44–1.00)
Creatinine, Ser: 1.25 mg/dL — ABNORMAL HIGH (ref 0.44–1.00)
Creatinine, Ser: 1.02 mg/dL — ABNORMAL HIGH (ref 0.44–1.00)
GFR, Estimated: 45 mL/min — ABNORMAL LOW (ref >60)
GFR, Estimated: 52 mL/min — ABNORMAL LOW (ref >60)
GFR, Estimated: >60 mL/min (ref >60)
Glucose, Bld: 491 mg/dL — ABNORMAL HIGH (ref 70–99)
Glucose, Bld: 450 mg/dL — ABNORMAL HIGH (ref 70–99)
Glucose, Bld: 816 mg/dL (ref 70–99)
Potassium: 4.2 mmol/L (ref 3.5–5.1)
Potassium: 4.3 mmol/L (ref 3.5–5.1)
Potassium: 3.6 mmol/L (ref 3.5–5.1)
Sodium: 125 mmol/L — ABNORMAL LOW (ref 135–145)
Sodium: 127 mmol/L — ABNORMAL LOW (ref 135–145)
Sodium: 116 mmol/L — CL (ref 135–145)

## 2022-02-07 LAB — CBC WITH DIFFERENTIAL/PLATELET
Abs Immature Granulocytes: 0.31 10*3/uL — ABNORMAL HIGH (ref 0.00–0.07)
Basophils Absolute: 0.1 10*3/uL (ref 0.0–0.1)
Basophils Relative: 0 %
Eosinophils Absolute: 0 10*3/uL (ref 0.0–0.5)
Eosinophils Relative: 0 %
HCT: 30.3 % — ABNORMAL LOW (ref 36.0–46.0)
Hemoglobin: 10.2 g/dL — ABNORMAL LOW (ref 12.0–15.0)
Immature Granulocytes: 2 %
Lymphocytes Relative: 3 %
Lymphs Abs: 0.7 10*3/uL (ref 0.7–4.0)
MCH: 32.5 pg (ref 26.0–34.0)
MCHC: 33.7 g/dL (ref 30.0–36.0)
MCV: 96.5 fL (ref 80.0–100.0)
Monocytes Absolute: 1.6 10*3/uL — ABNORMAL HIGH (ref 0.1–1.0)
Monocytes Relative: 8 %
Neutro Abs: 17.3 10*3/uL — ABNORMAL HIGH (ref 1.7–7.7)
Neutrophils Relative %: 87 %
Platelets: 248 10*3/uL (ref 150–400)
RBC: 3.14 MIL/uL — ABNORMAL LOW (ref 3.87–5.11)
RDW: 11.9 % (ref 11.5–15.5)
WBC: 19.9 10*3/uL — ABNORMAL HIGH (ref 4.0–10.5)
nRBC: 0 % (ref 0.0–0.2)

## 2022-02-07 LAB — BODY FLUID CELL COUNT WITH DIFFERENTIAL
Eos, Fluid: 0 %
Lymphs, Fluid: 0 %
Monocyte-Macrophage-Serous Fluid: 5 % — ABNORMAL LOW (ref 50–90)
Neutrophil Count, Fluid: 95 % — ABNORMAL HIGH (ref 0–25)
Total Nucleated Cell Count, Fluid: 6053 cu mm — ABNORMAL HIGH (ref 0–1000)

## 2022-02-07 LAB — COMPREHENSIVE METABOLIC PANEL
ALT: 10 U/L (ref 0–44)
AST: 12 U/L — ABNORMAL LOW (ref 15–41)
Albumin: 2.6 g/dL — ABNORMAL LOW (ref 3.5–5.0)
Alkaline Phosphatase: 58 U/L (ref 38–126)
Anion gap: 12 (ref 5–15)
BUN: 23 mg/dL — ABNORMAL HIGH (ref 6–20)
CO2: 21 mmol/L — ABNORMAL LOW (ref 22–32)
Calcium: 8 mg/dL — ABNORMAL LOW (ref 8.9–10.3)
Chloride: 97 mmol/L — ABNORMAL LOW (ref 98–111)
Creatinine, Ser: 1.07 mg/dL — ABNORMAL HIGH (ref 0.44–1.00)
GFR, Estimated: >60 mL/min (ref >60)
Glucose, Bld: 336 mg/dL — ABNORMAL HIGH (ref 70–99)
Potassium: 3.8 mmol/L (ref 3.5–5.1)
Sodium: 130 mmol/L — ABNORMAL LOW (ref 135–145)
Total Bilirubin: 1.1 mg/dL (ref 0.3–1.2)
Total Protein: 6.1 g/dL — ABNORMAL LOW (ref 6.5–8.1)

## 2022-02-07 LAB — MAGNESIUM: Magnesium: 2 mg/dL (ref 1.7–2.4)

## 2022-02-07 LAB — LIPASE, BLOOD: Lipase: 24 U/L (ref 11–51)

## 2022-02-07 LAB — CBG MONITORING, ED
Glucose-Capillary: 214 mg/dL — ABNORMAL HIGH (ref 70–99)
Glucose-Capillary: 398 mg/dL — ABNORMAL HIGH (ref 70–99)
Glucose-Capillary: 409 mg/dL — ABNORMAL HIGH (ref 70–99)
Glucose-Capillary: 422 mg/dL — ABNORMAL HIGH (ref 70–99)
Glucose-Capillary: 513 mg/dL (ref 70–99)

## 2022-02-07 LAB — HEPATIC FUNCTION PANEL
ALT: 9 U/L (ref 0–44)
AST: 12 U/L — ABNORMAL LOW (ref 15–41)
Albumin: 2.8 g/dL — ABNORMAL LOW (ref 3.5–5.0)
Alkaline Phosphatase: 67 U/L (ref 38–126)
Bilirubin, Direct: 0.1 mg/dL (ref 0.0–0.2)
Indirect Bilirubin: 0.4 mg/dL (ref 0.3–0.9)
Total Bilirubin: 0.5 mg/dL (ref 0.3–1.2)
Total Protein: 6.6 g/dL (ref 6.5–8.1)

## 2022-02-07 LAB — I-STAT BETA HCG BLOOD, ED (MC, WL, AP ONLY): I-stat hCG, quantitative: 39.9 m[IU]/mL — ABNORMAL HIGH (ref <5)

## 2022-02-07 LAB — LACTIC ACID, PLASMA
Lactic Acid, Venous: 1.1 mmol/L (ref 0.5–1.9)
Lactic Acid, Venous: 1.5 mmol/L (ref 0.5–1.9)

## 2022-02-07 LAB — BETA-HYDROXYBUTYRIC ACID
Beta-Hydroxybutyric Acid: 4.37 mmol/L — ABNORMAL HIGH (ref 0.05–0.27)
Beta-Hydroxybutyric Acid: 3.23 mmol/L — ABNORMAL HIGH (ref 0.05–0.27)

## 2022-02-07 LAB — PROTEIN, PLEURAL OR PERITONEAL FLUID: Total protein, fluid: 5.3 g/dL

## 2022-02-07 LAB — APTT: aPTT: 33 seconds (ref 24–36)

## 2022-02-07 LAB — LACTATE DEHYDROGENASE, PLEURAL OR PERITONEAL FLUID: LD, Fluid: 1083 U/L — ABNORMAL HIGH (ref 3–23)

## 2022-02-07 LAB — GLUCOSE, PLEURAL OR PERITONEAL FLUID: Glucose, Fluid: 218 mg/dL

## 2022-02-07 LAB — PHOSPHORUS: Phosphorus: 2 mg/dL — ABNORMAL LOW (ref 2.5–4.6)

## 2022-02-07 LAB — ALBUMIN, PLEURAL OR PERITONEAL FLUID: Albumin, Fluid: 2.5 g/dL

## 2022-02-07 LAB — HCG, QUANTITATIVE, PREGNANCY: hCG, Beta Chain, Quant, S: 7 m[IU]/mL — ABNORMAL HIGH (ref <5)

## 2022-02-07 MED ORDER — INSULIN REGULAR(HUMAN) IN NACL 100-0.9 UT/100ML-% IV SOLN: INTRAVENOUS | Status: DC

## 2022-02-07 MED ORDER — FENTANYL CITRATE PF 50 MCG/ML IJ SOSY
50.0000 ug | PREFILLED_SYRINGE | Freq: Once | INTRAMUSCULAR | Status: AC
  Administered 2022-02-07: 50 ug via INTRAVENOUS
  Filled 2022-02-07: qty 1

## 2022-02-07 MED ORDER — SODIUM CHLORIDE 0.9 % IV SOLN
2.0000 g | Freq: Three times a day (TID) | INTRAVENOUS | Status: DC
  Administered 2022-02-08 – 2022-02-09 (×4): 2 g via INTRAVENOUS
  Filled 2022-02-07 (×7): qty 12.5

## 2022-02-07 MED ORDER — LACTATED RINGERS IV SOLN: INTRAVENOUS | Status: DC

## 2022-02-07 MED ORDER — SODIUM CHLORIDE 0.9 % IV SOLN
500.0000 mg | Freq: Once | INTRAVENOUS | Status: AC
  Administered 2022-02-07: 500 mg via INTRAVENOUS
  Filled 2022-02-07: qty 5

## 2022-02-07 MED ORDER — DEXTROSE 50 % IV SOLN: 0.0000 mL | INTRAVENOUS | Status: DC | PRN

## 2022-02-07 MED ORDER — DEXTROSE IN LACTATED RINGERS 5 % IV SOLN: INTRAVENOUS | Status: DC

## 2022-02-07 MED ORDER — INSULIN REGULAR(HUMAN) IN NACL 100-0.9 UT/100ML-% IV SOLN
INTRAVENOUS | Status: DC
  Administered 2022-02-07: 13 [IU]/h via INTRAVENOUS
  Filled 2022-02-07: qty 100

## 2022-02-07 MED ORDER — LACTATED RINGERS IV BOLUS
20.0000 mL/kg | Freq: Once | INTRAVENOUS | Status: AC
  Administered 2022-02-07: 2178 mL via INTRAVENOUS

## 2022-02-07 MED ORDER — SODIUM CHLORIDE 0.9 % IV SOLN
500.0000 mg | INTRAVENOUS | Status: DC
  Administered 2022-02-08: 500 mg via INTRAVENOUS
  Filled 2022-02-07: qty 5

## 2022-02-07 MED ORDER — SODIUM CHLORIDE 0.9 % IV SOLN
2.0000 g | Freq: Once | INTRAVENOUS | Status: AC
  Administered 2022-02-07: 2 g via INTRAVENOUS
  Filled 2022-02-07: qty 20

## 2022-02-07 MED ORDER — LACTATED RINGERS IV SOLN: INTRAVENOUS | Status: AC

## 2022-02-07 MED ORDER — POTASSIUM CHLORIDE 10 MEQ/100ML IV SOLN
10.0000 meq | INTRAVENOUS | Status: AC
  Administered 2022-02-07 – 2022-02-08 (×2): 10 meq via INTRAVENOUS
  Filled 2022-02-07 (×2): qty 100

## 2022-02-07 MED ORDER — VANCOMYCIN HCL IN DEXTROSE 1-5 GM/200ML-% IV SOLN
1000.0000 mg | Freq: Once | INTRAVENOUS | Status: AC
  Administered 2022-02-07: 1000 mg via INTRAVENOUS
  Filled 2022-02-07: qty 200

## 2022-02-07 MED ORDER — IOHEXOL 300 MG/ML  SOLN
80.0000 mL | Freq: Once | INTRAMUSCULAR | Status: AC | PRN
  Administered 2022-02-07: 80 mL via INTRAVENOUS

## 2022-02-07 MED ORDER — SODIUM CHLORIDE (PF) 0.9 % IJ SOLN
INTRAMUSCULAR | Status: AC
  Administered 2022-02-08: 10 mL
  Filled 2022-02-07: qty 50

## 2022-02-07 NOTE — Progress Notes (Signed)
A consult was received from an ED physician for vancomycin per pharmacy dosing.  The patient's profile has been reviewed for ht/wt/allergies/indication/available labs.   ?A one time order has been placed for vancomycin 1g.  Further antibiotics/pharmacy consults should be ordered by admitting physician if indicated.       ?                ?Thank you, ?Loralee Pacas, PharmD, BCPS ?02/07/2022  5:31 PM ? ?

## 2022-02-07 NOTE — ED Notes (Signed)
Pulmonary MD at bedside to preform procedure ?

## 2022-02-07 NOTE — ED Triage Notes (Signed)
BIB [facility]. Patient states she had a fall this am and could not get up. EMS called to get patient up. Patients complaining of generalized abdominal pain, chest pain to touch, and shortness of breath when she moves. No pain when she is laying still. Patient has not been taking her meds dt not being able to get them from PCP. FSBS 587 by EMS. Occasionally takes Metformin. Patient states pain 10/10 when she moves but no pain when still. Having pain for past 5 days.  ?

## 2022-02-07 NOTE — Consult Note (Addendum)
? ?NAME:  Ashley Mcgee, MRN:  [id number], DOB:  1971-03-13, LOS: 0 ?ADMISSION DATE:  02/07/2022, CONSULTATION DATE:  02/07/22 ?REFERRING MD:  Karene Fry, CHIEF COMPLAINT:  I've fallen and I can't get up  ? ?History of Present Illness:  ?49yF with history of alcohol use, poorly controlled DM2 with DM neuropathy, HTN, HLD, smoking who was BIBEMS for fall today, she couldn't get up on her own afterward. She has abdominal pain, pleuritic and positional CP, dyspneic with exertion. In ED found to have mild DKA and hyperglycemia as well as loculated R effusion without rib fractures. WBCs of 20, Hb stable relative to prior. Vanc, ceftriaxone, azithromycin ordered. EDP discussed with CVTS on call who recommended involving pulmonary for pigtail and trial of lytics if needed.  ? ?Pertinent  Medical History  ?Etoh use  ?Poorly controlled DM ?HTN ?Smoking ? ?Significant Hospital Events: ?Including procedures, antibiotic start and stop dates in addition to other pertinent events   ?02/07/22 admitted for mild DKA, hyperglycemia and loculated R pleural effusion ?4/17 right pigtail chest tube inserted overnight with of amber colored fluid with significant sediment seen  ? ?Interim History / Subjective:  ?Seen lying in bed sleepy but would arouse to loud verbal stimuli  ? ?Objective   ?Blood pressure 139/64, pulse (!) 122, temperature 97.8 ?F (36.6 ?C), temperature source Oral, resp. rate 18, height 5\' 9"  (1.753 m), weight 108.9 kg, SpO2 96 %. ?   ?   ? ?Intake/Output Summary (Last 24 hours) at 02/07/2022 1825 ?Last data filed at 02/07/2022 1555 ?Gross per 24 hour  ?Intake 1315.35 ml  ?Output --  ?Net 1315.35 ml  ? ?Filed Weights  ? 02/07/22 1355  ?Weight: 108.9 kg  ? ?Examination: ?General: Acute on chronically ill appearing middle aged female lying in bed, in NAD ?HEENT: [place]/ATT, MM pink/moist, PERRL,  ?Neuro: Sleepy but will arouse to loud stimuli  ?CV: s1s2 regular rate and rhythm, no murmur, rubs, or gallops,  ?PULM:  Slightly  diminished breath sounds bilaterally, no increased work of breathing, no added breath sounds  ?GI: soft, bowel sounds active in all 4 quadrants, non-tender, non-distended ?Extremities: warm/dry, no edema  ?Skin: no rashes or lesions ? ?Resolved Hospital Problem list   ? ? ?Assessment & Plan:  ?Right loculated pleural effusion ?-CXR on admit revealed right mid to lower lobw opacification with concern for large effusion, S/P pigtail chest tube placement overnight  ?-Positive Light's Criteria ?Leukocytosis with concern for empyema  ?Tobacco use  ?P: ?Routine chest tube care  ?Daily CXR while chest tube in place  ?Monitor chest tube output  ?Will assess need for pleural lytics with attending  ?Mobilize as needed  ?Encourage pulmonary hygiene  ? ?Best Practice (right click and "Reselect all SmartList Selections" daily)  ?Per primary ? ?Labs   ?CBC: ?Recent Labs  ?Lab 02/07/22 ?1430  ?WBC 19.9*  ?NEUTROABS 17.3*  ?HGB 10.2*  ?HCT 30.3*  ?MCV 96.5  ?PLT 248  ? ? ?Basic Metabolic Panel: ?Recent Labs  ?Lab 02/07/22 ?1430 02/07/22 ?1607  ?NA 125* 127*  ?K 4.2 4.3  ?CL 93* 93*  ?CO2 19* 22  ?GLUCOSE 491* 450*  ?BUN 23* 23*  ?CREATININE 1.41* 1.25*  ?CALCIUM 8.1* 8.0*  ? ?GFR: ?Estimated Creatinine Clearance: 68.4 mL/min (A) (by C-G formula based on SCr of 1.25 mg/dL (H)). ?Recent Labs  ?Lab 02/07/22 ?1430  ?WBC 19.9*  ? ? ?Liver Function Tests: ?Recent Labs  ?Lab 02/07/22 ?1430  ?AST 12*  ?ALT 9  ?ALKPHOS 67  ?  BILITOT 0.5  ?PROT 6.6  ?ALBUMIN 2.8*  ? ?Recent Labs  ?Lab 02/07/22 ?1430  ?LIPASE 24  ? ?No results for input(s): AMMONIA in the last 168 hours. ? ?ABG ?   ?Component Value Date/Time  ? HCO3 20.2 02/07/2022 1430  ? TCO2 23 03/23/2020 0728  ? ACIDBASEDEF 4.4 (H) 02/07/2022 1430  ? O2SAT 55.9 02/07/2022 1430  ?  ? ?Coagulation Profile: ?No results for input(s): INR, PROTIME in the last 168 hours. ? ?Cardiac Enzymes: ?No results for input(s): CKTOTAL, CKMB, CKMBINDEX, TROPONINI in the last 168 hours. ? ?HbA1C: ?Hemoglobin  A1C  ?Date/Time Value Ref Range Status  ?04/11/2019 04:42 PM 11.3 (A) 4.0 - 5.6 % Final  ?11/13/2018 10:15 AM 9.9 (A) 4.0 - 5.6 % Final  ?03/21/2014 12:09 PM 9.9 (H) 4.2 - 6.3 % Final  ?  Comment:  ?  The American Diabetes Association recommends that a primary goal of ?therapy should be <7% and that physicians should reevaluate the ?treatment regimen in patients with HbA1c values consistently >8%. ?  ? ?HbA1c, POC (controlled diabetic range)  ?Date/Time Value Ref Range Status  ?07/14/2018 03:45 PM 5.6 0.0 - 7.0 % Final  ? ? ?CBG: ?Recent Labs  ?Lab 02/07/22 ?1401 02/07/22 ?1733  ?GLUCAP 513* 422*  ? ? ?Review of Systems:   ?12 point review of systems is negative except as in HPI ? ?Past Medical History:  ?She,  has a past medical history of Alcohol dependence in remission [facility](HCC), Diabetic peripheral neuropathy (HCC), Hyperlipidemia, Tobacco dependence, and Type 2 diabetes with complication (HCC).  ? ?Surgical History:  ? ?Past Surgical History:  ?Procedure Laterality Date  ? cataract surgery    ? TOTAL HIP ARTHROPLASTY Left 03/23/2020  ? Procedure: TOTAL HIP ARTHROPLASTY ANTERIOR APPROACH;  Surgeon: Durene Romanslin, Matthew, MD;  Location: [facility];  Service: Orthopedics;  Laterality: Left;  ?  ? ?Social History:  ? reports that she has been smoking cigarettes. She has been smoking an average of .5 packs per day. She has never used smokeless tobacco. She reports that she does not currently use drugs. She reports that she does not drink alcohol.  ? ?Family History:  ?Her family history includes Diabetes in her father and mother; Hypertension in her father and mother.  ? ?Allergies ?No Known Allergies  ? ?Home Medications  ?Prior to Admission medications   ?Medication Sig Start Date End Date Taking? Authorizing Provider  ?gabapentin (NEURONTIN) 300 MG capsule Take 1 capsule (300 mg total) by mouth 3 (three) times daily. For nerve pain ?Patient taking differently: Take 600 mg by mouth 3 (three) times daily. For nerve pain 04/03/20   Yes Anders SimmondsMcClung, Angela M, PA-C  ?ibuprofen (ADVIL) 200 MG tablet Take 200 mg by mouth every 6 (six) hours as needed for headache.   Yes [provider]  ?metFORMIN (GLUCOPHAGE) 500 MG tablet Take 1 tablet (500 mg total) by mouth 2 (two) times daily with a meal. ?Patient taking differently: Take 1,000 mg by mouth 2 (two) times daily with a meal. 04/03/20  Yes McClung, Angela M, PA-C  ?atorvastatin (LIPITOR) 40 MG tablet Take 40 mg by mouth daily. ?Patient not taking: Reported on 02/07/2022    [provider]  ?glipiZIDE (GLUCOTROL) 10 MG tablet Take 10 mg by mouth 2 (two) times daily before a meal. ?Patient not taking: Reported on 02/07/2022    [provider]  ?glucose blood (ACCU-CHEK AVIVA) test strip Use as instructed to check blood sugar 2-3 times per day 07/14/18  Fulp, Cammie, MD  ?insulin glargine (LANTUS SOLOSTAR) 100 UNIT/ML Solostar Pen Inject 12 Units into the skin daily. ?Patient not taking: Reported on 02/07/2022 04/03/20   Anders Simmonds, PA-C  ?Insulin Pen Needle (TRUEPLUS PEN NEEDLES) 32G X 4 MM MISC Use to inject insulin daily. 04/03/20   Anders Simmonds, PA-C  ?Lancets (ACCU-CHEK SOFT TOUCH) lancets Use as instructed 07/14/18   Fulp, Cammie, MD  ?lisinopril (ZESTRIL) 2.5 MG tablet Take 1 tablet (2.5 mg total) by mouth daily. ?Patient not taking: Reported on 02/07/2022 04/03/20   Anders Simmonds, PA-C  ?risperiDONE (RISPERDAL) 0.5 MG tablet Take 0.5 mg by mouth daily. ?Patient not taking: Reported on 02/07/2022    [provider]  ?risperiDONE (RISPERDAL) 2 MG tablet Take 2 mg by mouth at bedtime. ?Patient not taking: Reported on 02/07/2022    [provider]  ?  ? ?Critical care time: NA  ?Dru Primeau D. Harris, NP-C ?[facility] ?Personal contact information can be found on Amion  ?02/08/2022, 7:20 AM ? ? ? ? ?  ?

## 2022-02-07 NOTE — Progress Notes (Signed)
Elink following code sepsis °

## 2022-02-07 NOTE — Sepsis Progress Note (Signed)
Confirmed timing of antibiotics and blood cultures. Antibiotics are documented as given before blood cultures. Will continue to monitor code sepsis. ?

## 2022-02-07 NOTE — Progress Notes (Addendum)
Elink RN sent secure chat to bedside RN, pt is nearing 1 hr since code sepsis activated with no labs drawn and no abx started. Have reached out to see if there are any barriers preventing code sepsis ordered being carried out.  ? ?Bedside Rn responded appropriate measures are being taken to carry out code sepsis ?

## 2022-02-07 NOTE — H&P (Signed)
?History and Physical  ? ? Ashley Mcgee [id number] DOB: Mar 15, 1971 DOA: 02/07/2022 ? ?PCP: Antony Blackbird, MD  ?Patient coming from: home ? ?I have personally briefly reviewed patient's old medical records in [place] ? ?Chief Complaint: fall,abd pain /chest pain /sob ? ?HPI: Ashley Mcgee is a 51 y.o. female with medical history significant of history of alcohol abuse, poorly-controlled T2DM with diabetic neuropathy, HTN, HLD, and tobacco use who presents to ED BIB EMS s/p fall with complaints of sob, chest pain/abdominal pain. In the field patient blood sugar was noted to be over 500. Patient does admit to not being compliant with her medication as of late. Patient states that she has been ill for the last 3-4 days. She noted however no associated /f/c/ n/v but did note sob and generalized malaise. She denies any sick contact of productive cough. ? ? ?ED Course:  ?On evaluation in the ED patient was found to have sepsis due to  ?Large right sided pleural effusion/empyema associated multifocal pneumonia. IN addition to DKA.   ?Critical care was consulted who placed chest tube with plans for possible tpa as well due to signification loculation seen on CT.  ?Case discussed with Ct surgery who agrees with this approach. ?Re-consult if TPA is not effective. Patient was also place on DKA protocol ?Afeb, bp 143/60, hr 130, sat 87% ra  -> 90% on 2L ,rr40 ? ?Labs: ?Glu 513,  ?Wbc:19.9, hgb 10.2 (8.3), pmn 17.3 with bands  ?Vbg: 7.37, pco2:35 ?NA 125, K 4.2, co2 19, glu 491, cr 1.4 ( 0.66) AG13 ?Beta-hydroxybutyric 4.37 ?HCG:7/ istat 39.9 ?LR 2178 ?CT thorax ?IMPRESSION: ?Large multiloculated right pleural effusion concerning for exudative ?effusion, possibly empyema. Adjacent partial right lower lobe ?collapse. Multifocal nodular consolidations in the left lower lobe ?favored to be multifocal pneumonia. Recommend follow-up chest CT ?resolution. ?  ?No acute findings in the abdomen or pelvis. ?  ?Review of  Systems: As per HPI otherwise 10 point review of systems negative.  ? ?Past Medical History:  ?Diagnosis Date  ? Alcohol dependence in remission [facility])   ? Diabetic peripheral neuropathy ([place])   ? Hyperlipidemia   ? Tobacco dependence   ? Type 2 diabetes with complication (HCC)   ? ? ?Past Surgical History:  ?Procedure Laterality Date  ? cataract surgery    ? TOTAL HIP ARTHROPLASTY Left 03/23/2020  ? Procedure: TOTAL HIP ARTHROPLASTY ANTERIOR APPROACH;  Surgeon: Paralee Cancel, MD;  Location: [facility];  Service: Orthopedics;  Laterality: Left;  ? ? ? reports that she has been smoking cigarettes. She has been smoking an average of .5 packs per day. She has never used smokeless tobacco. She reports that she does not currently use drugs. She reports that she does not drink alcohol. ? ?No Known Allergies ? ?Family History  ?Problem Relation Age of Onset  ? Diabetes Mother   ? Hypertension Mother   ? Diabetes Father   ? Hypertension Father   ? ? ?Prior to Admission medications   ?Medication Sig Start Date End Date Taking? Authorizing Provider  ?gabapentin (NEURONTIN) 300 MG capsule Take 1 capsule (300 mg total) by mouth 3 (three) times daily. For nerve pain ?Patient taking differently: Take 600 mg by mouth 3 (three) times daily. For nerve pain 04/03/20  Yes Argentina Donovan, PA-C  ?ibuprofen (ADVIL) 200 MG tablet Take 200 mg by mouth every 6 (six) hours as needed for headache.   Yes [provider]  ?metFORMIN (GLUCOPHAGE) 500 MG tablet Take 1  tablet (500 mg total) by mouth 2 (two) times daily with a meal. ?Patient taking differently: Take 1,000 mg by mouth 2 (two) times daily with a meal. 04/03/20  Yes McClung, Angela M, PA-C  ?atorvastatin (LIPITOR) 40 MG tablet Take 40 mg by mouth daily. ?Patient not taking: Reported on 02/07/2022    [provider]  ?glipiZIDE (GLUCOTROL) 10 MG tablet Take 10 mg by mouth 2 (two) times daily before a meal. ?Patient not taking: Reported on 02/07/2022    [provider]  ?glucose blood (ACCU-CHEK AVIVA) test strip Use as instructed to check blood sugar 2-3 times per day 07/14/18   Fulp, Cammie, MD  ?insulin glargine (LANTUS SOLOSTAR) 100 UNIT/ML Solostar Pen Inject 12 Units into the skin daily. ?Patient not taking: Reported on 02/07/2022 04/03/20   Argentina Donovan, PA-C  ?Insulin Pen Needle (TRUEPLUS PEN NEEDLES) 32G X 4 MM MISC Use to inject insulin daily. 04/03/20   Argentina Donovan, PA-C  ?Lancets (ACCU-CHEK SOFT TOUCH) lancets Use as instructed 07/14/18   Fulp, Cammie, MD  ?lisinopril (ZESTRIL) 2.5 MG tablet Take 1 tablet (2.5 mg total) by mouth daily. ?Patient not taking: Reported on 02/07/2022 04/03/20   Argentina Donovan, PA-C  ?risperiDONE (RISPERDAL) 0.5 MG tablet Take 0.5 mg by mouth daily. ?Patient not taking: Reported on 02/07/2022    [provider]  ?risperiDONE (RISPERDAL) 2 MG tablet Take 2 mg by mouth at bedtime. ?Patient not taking: Reported on 02/07/2022    [provider]  ? ? ?Physical Exam: ?Vitals:  ? 02/07/22 1900 02/07/22 2000 02/07/22 2030 02/07/22 2105  ?BP: (!) 126/56 137/69 106/66 (!) 144/73  ?Pulse: (!) 121 (!) 123 (!) 125 (!) 132  ?Resp: 20 (!) 34  (!) 40  ?Temp:      ?TempSrc:      ?SpO2: 94% 99% 92% 90%  ?Weight:      ?Height:      ? ? ? ?Vitals:  ? 02/07/22 1900 02/07/22 2000 02/07/22 2030 02/07/22 2105  ?BP: (!) 126/56 137/69 106/66 (!) 144/73  ?Pulse: (!) 121 (!) 123 (!) 125 (!) 132  ?Resp: 20 (!) 34  (!) 40  ?Temp:      ?TempSrc:      ?SpO2: 94% 99% 92% 90%  ?Weight:      ?Height:      ?Constitutional: NAD, calm, comfortable, mild diaphoresis ?Eyes: PERRL, lids and conjunctivae normal ?ENMT: Mucous membranes are moist. Posterior pharynx clear of any exudate or lesions.poor dentition.  ?Neck: normal, supple, no masses, no thyromegaly ?Respiratory: coarse, decreased on right, no wheezing, no crackles. Normal respiratory effort. No accessory muscle use.  ?Cardiovascular: Regular  tachycardic, no murmurs / rubs / gallops. No extremity  edema. 2+ pedal pulses. ?Abdomen: no tenderness, no masses palpated. No hepatosplenomegaly. Bowel sounds positive.  ?Musculoskeletal: no clubbing / cyanosis. No joint deformity upper and lower extremities. Good ROM, no contractures. Normal muscle tone.  ?Skin: no rashes, lesions, ulcers. No induration ?Neurologic: CN 2-12 grossly intact. Sensation intact, Strength 5/5 in all 4.  ?Psychiatric: Normal judgment and insight. Alert and oriented x 3. Normal mood.  ? ? ?Labs on Admission: I have personally reviewed following labs and imaging studies ? ?CBC: ?Recent Labs  ?Lab 02/07/22 ?1430  ?WBC 19.9*  ?NEUTROABS 17.3*  ?HGB 10.2*  ?HCT 30.3*  ?MCV 96.5  ?PLT 248  ? ?Basic Metabolic Panel: ?Recent Labs  ?Lab 02/07/22 ?1430 02/07/22 ?1607  ?NA 125* 127*  ?K 4.2 4.3  ?CL 93*  93*  ?CO2 19* 22  ?GLUCOSE 491* 450*  ?BUN 23* 23*  ?CREATININE 1.41* 1.25*  ?CALCIUM 8.1* 8.0*  ? ?GFR: ?Estimated Creatinine Clearance: 68.4 mL/min (A) (by C-G formula based on SCr of 1.25 mg/dL (H)). ?Liver Function Tests: ?Recent Labs  ?Lab 02/07/22 ?1430  ?AST 12*  ?ALT 9  ?ALKPHOS 67  ?BILITOT 0.5  ?PROT 6.6  ?ALBUMIN 2.8*  ? ?Recent Labs  ?Lab 02/07/22 ?1430  ?LIPASE 24  ? ?No results for input(s): AMMONIA in the last 168 hours. ?Coagulation Profile: ?No results for input(s): INR, PROTIME in the last 168 hours. ?Cardiac Enzymes: ?No results for input(s): CKTOTAL, CKMB, CKMBINDEX, TROPONINI in the last 168 hours. ?BNP (last 3 results) ?No results for input(s): PROBNP in the last 8760 hours. ?HbA1C: ?No results for input(s): HGBA1C in the last 72 hours. ?CBG: ?Recent Labs  ?Lab 02/07/22 ?1401 02/07/22 ?1733 02/07/22 ?2113  ?[place] ?Lipid Profile: ?No results for input(s): CHOL, HDL, LDLCALC, TRIG, CHOLHDL, LDLDIRECT in the last 72 hours. ?Thyroid Function Tests: ?No results for input(s): TSH, T4TOTAL, FREET4, T3FREE, THYROIDAB in the last 72 hours. ?Anemia Panel: ?No results for input(s): VITAMINB12, FOLATE, FERRITIN, TIBC, IRON,  RETICCTPCT in the last 72 hours. ?Urine analysis: ?   ?Component Value Date/Time  ? COLORURINE Yellow 03/21/2014 1209  ? APPEARANCEUR Clear 03/21/2014 1209  ? LABSPEC 1.015 03/21/2014 1209  ? PHURINE 5.

## 2022-02-07 NOTE — Procedures (Signed)
This is a procedure note for pigtail chest tube ? ?Consent verbal ?Area was prepped and draped in sterile fashion CAT scan was reviewed needle was introduced in the right pleural space with return of straw-colored pleural fluid.  Fluid was turbid with protein debris's the area was dilated over the wire and Seldinger technique pigtail 14 French right-sided chest tube was inserted without complications.  At this point we will put the chest tube to suction and obtain chest x-ray. ?

## 2022-02-07 NOTE — ED Notes (Signed)
Patient just transported to XRAY and CT scan via stretcher. JRPRN ?

## 2022-02-07 NOTE — ED Provider Notes (Addendum)
?[facility]-EMERGENCY DEPT ?Provider Note ? ? ?CSN: [id number] ?Arrival date & time: 02/07/22  1328 ? ?  ? ?History ? ?Chief Complaint  ?Patient presents with  ? Abdominal Pain  ? Fall  ? Hyperglycemia  ? Chest Pain  ? Shortness of Breath  ?  BIB [facility]. Patient states she had a fall this am and could not get up. EMS called to get patient up. Patients complaining of generalized abdominal pain, chest pain to touch, and shortness of breath when she moves. No pain when she is laying still. Patient has not been taking her meds dt not being able to get them from PCP. FSBS 587 by EMS. Occasionally takes Metformin. Patient states pain 10/10 when she moves but no pain when still. Having pain for past 5 days.   ? ? ?Ashley Mcgee is a 51 y.o. female. ? ?HPI ? ?  51 year old female with medical history significant for alcohol dependence in remission, HLD, DM 2, HLD who presents to the emergency department with right-sided chest and abdominal pain, elevated blood sugars.  The patient states that she fell this morning and had difficulty getting up from the floor.  She has had generalized abdominal pain and right-sided chest pain with associated shortness of breath that is worse with movement.  She was found to have hyperglycemia to 587 with EMS.  She states that she intermittently is compliant with her home antiglycemic regimen of metformin.  She endorses 10 of 10 pain when moving with some pleuritic chest discomfort. ? ?Home Medications ?Prior to Admission medications   ?Medication Sig Start Date End Date Taking? Authorizing Provider  ?gabapentin (NEURONTIN) 300 MG capsule Take 1 capsule (300 mg total) by mouth 3 (three) times daily. For nerve pain ?Patient taking differently: Take 600 mg by mouth 3 (three) times daily. For nerve pain 04/03/20  Yes Anders Simmonds, PA-C  ?ibuprofen (ADVIL) 200 MG tablet Take 200 mg by mouth every 6 (six) hours as needed for headache.   Yes [provider]   ?metFORMIN (GLUCOPHAGE) 500 MG tablet Take 1 tablet (500 mg total) by mouth 2 (two) times daily with a meal. ?Patient taking differently: Take 1,000 mg by mouth 2 (two) times daily with a meal. 04/03/20  Yes McClung, Angela M, PA-C  ?atorvastatin (LIPITOR) 40 MG tablet Take 40 mg by mouth daily. ?Patient not taking: Reported on 02/07/2022    [provider]  ?glipiZIDE (GLUCOTROL) 10 MG tablet Take 10 mg by mouth 2 (two) times daily before a meal. ?Patient not taking: Reported on 02/07/2022    [provider]  ?glucose blood (ACCU-CHEK AVIVA) test strip Use as instructed to check blood sugar 2-3 times per day 07/14/18   Fulp, Cammie, MD  ?insulin glargine (LANTUS SOLOSTAR) 100 UNIT/ML Solostar Pen Inject 12 Units into the skin daily. ?Patient not taking: Reported on 02/07/2022 04/03/20   Anders Simmonds, PA-C  ?Insulin Pen Needle (TRUEPLUS PEN NEEDLES) 32G X 4 MM MISC Use to inject insulin daily. 04/03/20   Anders Simmonds, PA-C  ?Lancets (ACCU-CHEK SOFT TOUCH) lancets Use as instructed 07/14/18   Fulp, Cammie, MD  ?lisinopril (ZESTRIL) 2.5 MG tablet Take 1 tablet (2.5 mg total) by mouth daily. ?Patient not taking: Reported on 02/07/2022 04/03/20   Anders Simmonds, PA-C  ?risperiDONE (RISPERDAL) 0.5 MG tablet Take 0.5 mg by mouth daily. ?Patient not taking: Reported on 02/07/2022    [provider]  ?risperiDONE (RISPERDAL) 2 MG tablet Take 2 mg  by mouth at bedtime. ?Patient not taking: Reported on 02/07/2022    [provider]  ?   ? ?Allergies    ?Patient has no known allergies.   ? ?Review of Systems   ?Review of Systems  ?All other systems reviewed and are negative. ? ?Physical Exam ?Updated Vital Signs ?BP (!) 144/73 (BP Location: Left Arm)   Pulse (!) 132   Temp 97.8 ?F (36.6 ?C) (Oral)   Resp (!) 40   Ht 5\' 9"  (1.753 m)   Wt 108.9 kg   SpO2 90%   BMI 35.44 kg/m?  ?Physical Exam ?Vitals and nursing note reviewed.  ?Constitutional:   ?   General: She is not in acute  distress. ?   Appearance: She is well-developed.  ?HENT:  ?   Head: Normocephalic and atraumatic.  ?   Mouth/Throat:  ?   Mouth: Mucous membranes are dry.  ?Eyes:  ?   Conjunctiva/sclera: Conjunctivae normal.  ?Cardiovascular:  ?   Rate and Rhythm: Regular rhythm. Tachycardia present.  ?Pulmonary:  ?   Effort: Pulmonary effort is normal. No respiratory distress.  ?   Breath sounds: Normal breath sounds.  ?Chest:  ?   Comments: Right-sided chest wall tenderness to palpation ?Abdominal:  ?   Palpations: Abdomen is soft.  ?   Tenderness: There is abdominal tenderness in the right upper quadrant and epigastric area. There is guarding.  ?Musculoskeletal:     ?   General: No swelling.  ?   Cervical back: Neck supple.  ?Skin: ?   General: Skin is warm and dry.  ?   Capillary Refill: Capillary refill takes less than 2 seconds.  ?Neurological:  ?   Mental Status: She is alert.  ?Psychiatric:     ?   Mood and Affect: Mood normal.  ? ? ?ED Results / Procedures / Treatments   ?Labs ?(all labs ordered are listed, but only abnormal results are displayed) ?Labs Reviewed  ?BASIC METABOLIC PANEL - Abnormal; Notable for the following components:  ?    Result Value  ? Sodium 125 (*)   ? Chloride 93 (*)   ? CO2 19 (*)   ? Glucose, Bld 491 (*)   ? BUN 23 (*)   ? Creatinine, Ser 1.41 (*)   ? Calcium 8.1 (*)   ? GFR, Estimated 45 (*)   ? All other components within normal limits  ?BETA-HYDROXYBUTYRIC ACID - Abnormal; Notable for the following components:  ? Beta-Hydroxybutyric Acid 4.37 (*)   ? All other components within normal limits  ?CBC WITH DIFFERENTIAL/PLATELET - Abnormal; Notable for the following components:  ? WBC 19.9 (*)   ? RBC 3.14 (*)   ? Hemoglobin 10.2 (*)   ? HCT 30.3 (*)   ? Neutro Abs 17.3 (*)   ? Monocytes Absolute 1.6 (*)   ? Abs Immature Granulocytes 0.31 (*)   ? All other components within normal limits  ?BLOOD GAS, VENOUS - Abnormal; Notable for the following components:  ? pCO2, Ven 35 (*)   ? pO2, Ven <31 (*)    ? Acid-base deficit 4.4 (*)   ? All other components within normal limits  ?HEPATIC FUNCTION PANEL - Abnormal; Notable for the following components:  ? Albumin 2.8 (*)   ? AST 12 (*)   ? All other components within normal limits  ?HCG, QUANTITATIVE, PREGNANCY - Abnormal; Notable for the following components:  ? hCG, Beta Chain, Quant, S 7 (*)   ? All other  components within normal limits  ?BASIC METABOLIC PANEL - Abnormal; Notable for the following components:  ? Sodium 127 (*)   ? Chloride 93 (*)   ? Glucose, Bld 450 (*)   ? BUN 23 (*)   ? Creatinine, Ser 1.25 (*)   ? Calcium 8.0 (*)   ? GFR, Estimated 52 (*)   ? All other components within normal limits  ?CBG MONITORING, ED - Abnormal; Notable for the following components:  ? Glucose-Capillary 513 (*)   ? All other components within normal limits  ?I-STAT BETA HCG BLOOD, ED ([facility], [facility], [facility] ONLY) - Abnormal; Notable for the following components:  ? I-stat hCG, quantitative 39.9 (*)   ? All other components within normal limits  ?CBG MONITORING, ED - Abnormal; Notable for the following components:  ? Glucose-Capillary 422 (*)   ? All other components within normal limits  ?CULTURE, BLOOD (SINGLE)  ?CULTURE, BLOOD (ROUTINE X 2)  ?CULTURE, BLOOD (ROUTINE X 2)  ?URINE CULTURE  ?RESP PANEL BY RT-PCR (FLU A&B, COVID) ARPGX2  ?BODY FLUID CULTURE W GRAM STAIN  ?FUNGUS CULTURE WITH STAIN  ?LIPASE, BLOOD  ?BASIC METABOLIC PANEL  ?BASIC METABOLIC PANEL  ?BASIC METABOLIC PANEL  ?BETA-HYDROXYBUTYRIC ACID  ?URINALYSIS, ROUTINE W REFLEX MICROSCOPIC  ?LACTIC ACID, PLASMA  ?LACTIC ACID, PLASMA  ?PREGNANCY, URINE  ?BASIC METABOLIC PANEL  ?BETA-HYDROXYBUTYRIC ACID  ?ALBUMIN, PLEURAL OR PERITONEAL FLUID   ?BODY FLUID CELL COUNT WITH DIFFERENTIAL  ?GLUCOSE, PLEURAL OR PERITONEAL FLUID  ?LACTATE DEHYDROGENASE, PLEURAL OR PERITONEAL FLUID  ?MISCELLANEOUS TEST  ?PROTEIN, PLEURAL OR PERITONEAL FLUID  ?CYTOLOGY - NON PAP  ? ? ?EKG ?None ? ?Radiology ?DG Chest 2 View ? ?Result Date:  02/07/2022 ?CLINICAL DATA:  Chest pain EXAM: CHEST - 2 VIEW COMPARISON:  None. FINDINGS: The cardiomediastinal silhouette is within normal limits. There is opacification of the right mid to lower hemithorax. No pneumothorax. No

## 2022-02-08 DIAGNOSIS — J189 Pneumonia, unspecified organism: Secondary | ICD-10-CM | POA: Diagnosis not present

## 2022-02-08 DIAGNOSIS — J869 Pyothorax without fistula: Secondary | ICD-10-CM | POA: Insufficient documentation

## 2022-02-08 DIAGNOSIS — J918 Pleural effusion in other conditions classified elsewhere: Secondary | ICD-10-CM | POA: Diagnosis not present

## 2022-02-08 LAB — URINALYSIS, ROUTINE W REFLEX MICROSCOPIC
Bilirubin Urine: NEGATIVE
Glucose, UA: >=500 mg/dL — AB
Hgb urine dipstick: NEGATIVE
Ketones, ur: 5 mg/dL — AB
Leukocytes,Ua: NEGATIVE
Nitrite: NEGATIVE
Protein, ur: 30 mg/dL — AB
Specific Gravity, Urine: 1.029 (ref 1.005–1.030)
pH: 5 (ref 5.0–8.0)

## 2022-02-08 LAB — CBG MONITORING, ED
Glucose-Capillary: 122 mg/dL — ABNORMAL HIGH (ref 70–99)
Glucose-Capillary: 152 mg/dL — ABNORMAL HIGH (ref 70–99)
Glucose-Capillary: 178 mg/dL — ABNORMAL HIGH (ref 70–99)
Glucose-Capillary: 172 mg/dL — ABNORMAL HIGH (ref 70–99)
Glucose-Capillary: 164 mg/dL — ABNORMAL HIGH (ref 70–99)
Glucose-Capillary: 137 mg/dL — ABNORMAL HIGH (ref 70–99)
Glucose-Capillary: 156 mg/dL — ABNORMAL HIGH (ref 70–99)
Glucose-Capillary: 155 mg/dL — ABNORMAL HIGH (ref 70–99)
Glucose-Capillary: 203 mg/dL — ABNORMAL HIGH (ref 70–99)
Glucose-Capillary: 211 mg/dL — ABNORMAL HIGH (ref 70–99)
Glucose-Capillary: 182 mg/dL — ABNORMAL HIGH (ref 70–99)
Glucose-Capillary: 208 mg/dL — ABNORMAL HIGH (ref 70–99)
Glucose-Capillary: 187 mg/dL — ABNORMAL HIGH (ref 70–99)
Glucose-Capillary: 199 mg/dL — ABNORMAL HIGH (ref 70–99)

## 2022-02-08 LAB — CBC WITH DIFFERENTIAL/PLATELET
Abs Immature Granulocytes: 0.1 10*3/uL — ABNORMAL HIGH (ref 0.00–0.07)
Basophils Absolute: 0.1 10*3/uL (ref 0.0–0.1)
Basophils Relative: 0 %
Eosinophils Absolute: 0 10*3/uL (ref 0.0–0.5)
Eosinophils Relative: 0 %
HCT: 29 % — ABNORMAL LOW (ref 36.0–46.0)
Hemoglobin: 9.9 g/dL — ABNORMAL LOW (ref 12.0–15.0)
Immature Granulocytes: 1 %
Lymphocytes Relative: 11 %
Lymphs Abs: 1.7 10*3/uL (ref 0.7–4.0)
MCH: 32.7 pg (ref 26.0–34.0)
MCHC: 34.1 g/dL (ref 30.0–36.0)
MCV: 95.7 fL (ref 80.0–100.0)
Monocytes Absolute: 1.3 10*3/uL — ABNORMAL HIGH (ref 0.1–1.0)
Monocytes Relative: 8 %
Neutro Abs: 12.4 10*3/uL — ABNORMAL HIGH (ref 1.7–7.7)
Neutrophils Relative %: 80 %
Platelets: 237 10*3/uL (ref 150–400)
RBC: 3.03 MIL/uL — ABNORMAL LOW (ref 3.87–5.11)
RDW: 12 % (ref 11.5–15.5)
WBC: 15.5 10*3/uL — ABNORMAL HIGH (ref 4.0–10.5)
nRBC: 0 % (ref 0.0–0.2)

## 2022-02-08 LAB — COMPREHENSIVE METABOLIC PANEL
ALT: 9 U/L (ref 0–44)
AST: 13 U/L — ABNORMAL LOW (ref 15–41)
Albumin: 2.3 g/dL — ABNORMAL LOW (ref 3.5–5.0)
Alkaline Phosphatase: 51 U/L (ref 38–126)
Anion gap: 7 (ref 5–15)
BUN: 22 mg/dL — ABNORMAL HIGH (ref 6–20)
CO2: 25 mmol/L (ref 22–32)
Calcium: 8.1 mg/dL — ABNORMAL LOW (ref 8.9–10.3)
Chloride: 99 mmol/L (ref 98–111)
Creatinine, Ser: 0.97 mg/dL (ref 0.44–1.00)
GFR, Estimated: >60 mL/min (ref >60)
Glucose, Bld: 264 mg/dL — ABNORMAL HIGH (ref 70–99)
Potassium: 3.4 mmol/L — ABNORMAL LOW (ref 3.5–5.1)
Sodium: 131 mmol/L — ABNORMAL LOW (ref 135–145)
Total Bilirubin: 0.5 mg/dL (ref 0.3–1.2)
Total Protein: 5.8 g/dL — ABNORMAL LOW (ref 6.5–8.1)

## 2022-02-08 LAB — OSMOLALITY: Osmolality: 286 mOsm/kg (ref 275–295)

## 2022-02-08 LAB — BETA-HYDROXYBUTYRIC ACID
Beta-Hydroxybutyric Acid: 2.74 mmol/L — ABNORMAL HIGH (ref 0.05–0.27)
Beta-Hydroxybutyric Acid: 0.57 mmol/L — ABNORMAL HIGH (ref 0.05–0.27)

## 2022-02-08 LAB — PROCALCITONIN: Procalcitonin: 6.89 ng/mL

## 2022-02-08 LAB — BRAIN NATRIURETIC PEPTIDE: B Natriuretic Peptide: 76.1 pg/mL (ref 0.0–100.0)

## 2022-02-08 LAB — PREGNANCY, URINE: Preg Test, Ur: NEGATIVE

## 2022-02-08 LAB — TSH: TSH: 0.733 u[IU]/mL (ref 0.350–4.500)

## 2022-02-08 LAB — HEMOGLOBIN A1C
Hgb A1c MFr Bld: 11.2 % — ABNORMAL HIGH (ref 4.8–5.6)
Mean Plasma Glucose: 274.74 mg/dL

## 2022-02-08 LAB — HIV ANTIBODY (ROUTINE TESTING W REFLEX): HIV Screen 4th Generation wRfx: NONREACTIVE

## 2022-02-08 LAB — GLUCOSE, CAPILLARY: Glucose-Capillary: 219 mg/dL — ABNORMAL HIGH (ref 70–99)

## 2022-02-08 MED ORDER — LIDOCAINE 5 % EX PTCH
1.0000 | MEDICATED_PATCH | CUTANEOUS | Status: DC
  Administered 2022-02-08 – 2022-02-10 (×3): 1 via TRANSDERMAL
  Filled 2022-02-08 (×4): qty 1

## 2022-02-08 MED ORDER — OXYCODONE HCL 5 MG PO TABS
5.0000 mg | ORAL_TABLET | Freq: Four times a day (QID) | ORAL | Status: DC | PRN
Start: 2022-02-08 — End: 2022-02-11
  Administered 2022-02-11 (×2): 5 mg via ORAL
  Filled 2022-02-08 (×4): qty 1

## 2022-02-08 MED ORDER — SODIUM CHLORIDE 0.9% FLUSH
10.0000 mL | Freq: Three times a day (TID) | INTRAVENOUS | Status: DC
  Administered 2022-02-09 – 2022-02-10 (×6): 10 mL

## 2022-02-08 MED ORDER — DEXTROSE IN LACTATED RINGERS 5 % IV SOLN: INTRAVENOUS | Status: AC

## 2022-02-08 MED ORDER — INSULIN GLARGINE-YFGN 100 UNIT/ML ~~LOC~~ SOLN
22.0000 [IU] | Freq: Every day | SUBCUTANEOUS | Status: DC
  Administered 2022-02-08 – 2022-02-11 (×4): 22 [IU] via SUBCUTANEOUS
  Filled 2022-02-08 (×4): qty 0.22

## 2022-02-08 MED ORDER — SODIUM CHLORIDE (PF) 0.9 % IJ SOLN
10.0000 mg | Freq: Once | INTRAMUSCULAR | Status: AC
  Administered 2022-02-08: 10 mg via INTRAPLEURAL
  Filled 2022-02-08: qty 10

## 2022-02-08 MED ORDER — STERILE WATER FOR INJECTION IJ SOLN
5.0000 mg | Freq: Once | RESPIRATORY_TRACT | Status: AC
  Administered 2022-02-08: 5 mg via INTRAPLEURAL
  Filled 2022-02-08: qty 5

## 2022-02-08 MED ORDER — HYDROMORPHONE HCL 1 MG/ML IJ SOLN
0.5000 mg | INTRAMUSCULAR | Status: DC | PRN
  Administered 2022-02-08 – 2022-02-10 (×6): 0.5 mg via INTRAVENOUS
  Filled 2022-02-08 (×6): qty 0.5

## 2022-02-08 MED ORDER — INSULIN REGULAR(HUMAN) IN NACL 100-0.9 UT/100ML-% IV SOLN
INTRAVENOUS | Status: AC
  Administered 2022-02-08: 4.4 [IU]/h via INTRAVENOUS
  Filled 2022-02-08: qty 100

## 2022-02-08 MED ORDER — OXYCODONE HCL 5 MG PO TABS
5.0000 mg | ORAL_TABLET | Freq: Four times a day (QID) | ORAL | Status: DC | PRN
  Administered 2022-02-08 – 2022-02-09 (×2): 5 mg via ORAL

## 2022-02-08 MED ORDER — ACETAMINOPHEN 325 MG PO TABS
650.0000 mg | ORAL_TABLET | Freq: Four times a day (QID) | ORAL | Status: DC | PRN
  Administered 2022-02-08 (×2): 650 mg via ORAL
  Filled 2022-02-08 (×2): qty 2

## 2022-02-08 NOTE — Progress Notes (Signed)
? ?PROGRESS NOTE ? ?Ashley AmassKristin Mcgee ZOX:[id number] DOB: [birth year] DOA: 02/07/2022 ?PCP: Cain SaupeFulp, Cammie, MD ? ?HPI/Recap of past 24 hours: ?Per H&P dictated by Dr. Maisie Fushomas: "Ashley AmassKristin [place] is a 51 y.o. female with medical history significant of history of alcohol abuse, poorly-controlled T2DM with diabetic neuropathy, HTN, HLD, and tobacco use who presents to ED BIB EMS s/p fall with complaints of sob, chest pain/abdominal pain. In the field patient blood sugar was noted to be over 500. Patient does admit to not being compliant with her medication as of late. Patient states that she has been ill for the last 3-4 days. She noted however no associated /f/c/ n/v but did note sob and generalized malaise. She denies any sick contact of productive cough." ? ?02/08/2022: Patient was seen and examined at her bedside in the ED.  Right chest tube in place with amber-colored fluid present. ? ?Assessment/Plan: ?Principal Problem: ?  CAP (community acquired pneumonia) ? ?CAP with right-sided loculated empyema associated Acute hypoxic respiratory failure post chest tube placement by PCCM ?Appreciate PCCM assistance.  1100 cc of amber-colored fluid with significant sediment seen. ?Continue broad-spectrum IV antibiotics, on cefepime and azithromycin. ?Follow cultures ?Pulmonary will assess need for pleural lytics ? ?Type 2 diabetes with hyperglycemia ?Hemoglobin A1c 11.2 on 02/07/2022 ?Continue insulin coverage ?  ?HTN, uncontrolled. ?BP is not at goal, elevated. ?Per medical records she is not on oral antihypertensives prior to admission. ?Start Norvasc 5 mg daily. ?Continue to closely monitor vital signs. ? ?Tobacco abuse ?-encourage cessation  ?  ?DVT prophylaxis: SCD  ?Code Status: FULL ?Family Communication: None at bedside. ?Disposition Plan: patient  expected to be admitted greater than 2 midnights  ?Consults called: critical care Catha GosselinIsmail MD ? ? ? ?Status is: Inpatient ?Patient requires at least 2 midnights for further evaluation  and treatment of present condition. ? ? ? ?Objective: ?Vitals:  ? 02/08/22 1031 02/08/22 1200 02/08/22 1230 02/08/22 1330  ?BP: (!) 144/68 126/60 129/68 (!) 157/83  ?Pulse: (!) 113 (!) 103 (!) 108 (!) 118  ?Resp: (!) 22 (!) 27 (!) 28 19  ?Temp:      ?TempSrc:      ?SpO2: 96% 95% 91% 93%  ?Weight:      ?Height:      ? ? ?Intake/Output Summary (Last 24 hours) at 02/08/2022 1343 ?Last data filed at 02/08/2022 0708 ?Gross per 24 hour  ?Intake 2592.43 ml  ?Output 2000 ml  ?Net 592.43 ml  ? ?Filed Weights  ? 02/07/22 1355  ?Weight: 108.9 kg  ? ? ?Exam: ? ?General: 51 y.o. year-old female well developed well nourished in no acute distress.  Alert and oriented x3. ?Cardiovascular: Regular rate and rhythm with no rubs or gallops.  No thyromegaly or JVD noted.   ?Respiratory: Mild rales at bases.  No wheezing noted.  Poor inspiratory effort.  Right chest tube in place. ?Abdomen: Soft nontender nondistended with normal bowel sounds x4 quadrants. ?Musculoskeletal: No lower extremity edema. 2/4 pulses in all 4 extremities. ?Skin: No ulcerative lesions noted or rashes, ?Psychiatry: Mood is appropriate for condition and setting ? ? ?Data Reviewed: ?CBC: ?Recent Labs  ?Lab 02/07/22 ?1430 02/08/22 ?[id number]  ?WBC 19.9* 15.5*  ?NEUTROABS 17.3* 12.4*  ?HGB 10.2* 9.9*  ?HCT 30.3* 29.0*  ?MCV 96.5 95.7  ?PLT 248 237  ? ?Basic Metabolic Panel: ?Recent Labs  ?Lab 02/07/22 ?1430 02/07/22 ?1607 02/07/22 ?2108 02/07/22 ?2251 02/08/22 ?[id number]  ?NA 125* 127* 116* 130* 131*  ?K 4.2 4.3 3.6 3.8 3.4*  ?CL 93*  93* 85* 97* 99  ?CO2 19* 22 18* 21* 25  ?GLUCOSE 491* 450* 816* 336* 264*  ?BUN 23* 23* 20 23* 22*  ?CREATININE 1.41* 1.25* 1.02* 1.07* 0.97  ?CALCIUM 8.1* 8.0* 7.0* 8.0* 8.1*  ?MG  --   --   --  2.0  --   ?PHOS  --   --   --  2.0*  --   ? ?GFR: ?Estimated Creatinine Clearance: 88.2 mL/min (by C-G formula based on SCr of 0.97 mg/dL). ?Liver Function Tests: ?Recent Labs  ?Lab 02/07/22 ?1430 02/07/22 ?2251 02/08/22 ?7425  ?AST 12* 12* 13*  ?ALT 9 10 9    ?ALKPHOS 67 58 51  ?BILITOT 0.5 1.1 0.5  ?PROT 6.6 6.1* 5.8*  ?ALBUMIN 2.8* 2.6* 2.3*  ? ?Recent Labs  ?Lab 02/07/22 ?1430  ?LIPASE 24  ? ?No results for input(s): AMMONIA in the last 168 hours. ?Coagulation Profile: ?Recent Labs  ?Lab 02/07/22 ?2251  ?INR 1.3*  ? ?Cardiac Enzymes: ?No results for input(s): CKTOTAL, CKMB, CKMBINDEX, TROPONINI in the last 168 hours. ?BNP (last 3 results) ?No results for input(s): PROBNP in the last 8760 hours. ?HbA1C: ?Recent Labs  ?  02/07/22 ?2252  ?HGBA1C 11.2*  ? ?CBG: ?Recent Labs  ?Lab 02/08/22 ?0518 02/08/22 ?02/10/22 02/08/22 ?02/10/22 02/08/22 ?1140 02/08/22 ?1254  ?GLUCAP 172* 164* 137* 156* 155*  ? ?Lipid Profile: ?No results for input(s): CHOL, HDL, LDLCALC, TRIG, CHOLHDL, LDLDIRECT in the last 72 hours. ?Thyroid Function Tests: ?Recent Labs  ?  02/07/22 ?2252  ?TSH 0.733  ? ?Anemia Panel: ?No results for input(s): VITAMINB12, FOLATE, FERRITIN, TIBC, IRON, RETICCTPCT in the last 72 hours. ?Urine analysis: ?   ?Component Value Date/Time  ? COLORURINE YELLOW 02/08/2022 0649  ? APPEARANCEUR CLOUDY (A) 02/08/2022 0649  ? APPEARANCEUR Clear 03/21/2014 1209  ? LABSPEC 1.029 02/08/2022 0649  ? LABSPEC 1.015 03/21/2014 1209  ? PHURINE 5.0 02/08/2022 0649  ? GLUCOSEU >=500 (A) 02/08/2022 0649  ? GLUCOSEU >=500 03/21/2014 1209  ? HGBUR NEGATIVE 02/08/2022 0649  ? BILIRUBINUR NEGATIVE 02/08/2022 0649  ? BILIRUBINUR negative 12/25/2018 1413  ? BILIRUBINUR Negative 03/21/2014 1209  ? KETONESUR 5 (A) 02/08/2022 02/10/2022  ? PROTEINUR 30 (A) 02/08/2022 02/10/2022  ? UROBILINOGEN 0.2 12/25/2018 1413  ? NITRITE NEGATIVE 02/08/2022 0649  ? LEUKOCYTESUR NEGATIVE 02/08/2022 0649  ? LEUKOCYTESUR Negative 03/21/2014 1209  ? ?Sepsis Labs: ?@LABRCNTIP (procalcitonin:4,lacticidven:4) ? ?) ?Recent Results (from the past 240 hour(s))  ?Body fluid culture w Gram Stain     Status: None (Preliminary result)  ? Collection Time: 02/07/22  8:55 PM  ? Specimen: Pleural Fluid  ?Result Value Ref Range Status  ? Specimen  Description   Final  ?  PLEURAL ?Performed at [facility], [address] [place], [place] ?  ? Special Requests   Final  ?  NONE ?Performed at [facility], [address] [place], [place] ?  ? Gram Stain   Final  ?  MODERATE WBC PRESENT,BOTH PMN AND MONONUCLEAR ?NO ORGANISMS SEEN ?  ? Culture   Final  ?  NO GROWTH < 12 HOURS ?Performed at [facility], [address], [place], [address] [place] ?  ? Report Status PENDING  Incomplete  ?  ? ? ?Studies: ?DG Chest 2 View ? ?Result Date: 02/07/2022 ?CLINICAL DATA:  Chest pain EXAM: CHEST - 2 VIEW COMPARISON:  None. FINDINGS: The cardiomediastinal silhouette is within normal limits. There is opacification of the right mid to lower hemithorax.  No pneumothorax. No visible acute osseous abnormality radiographically. IMPRESSION: Opacification of the right mid to lower hemithorax, could be a large effusion with adjacent atelectasis. Underlying infection or mass is not excluded. Chest CT is pending. Electronically Signed   By: Caprice Renshaw M.D.   On: 02/07/2022 15:27  ? ?CT Chest W Contrast ? ?Result Date: 02/07/2022 ?CLINICAL DATA:  Abdominal pain, acute, nonlocalized RUQ pain; Abnormal xray - lung opacity/opacities EXAM: CT CHEST AND ABDOMEN WITH CONTRAST TECHNIQUE: Multidetector CT imaging of the chest and abdomen was performed following the standard protocol during bolus administration of intravenous contrast. RADIATION DOSE REDUCTION: This exam was performed according to the departmental dose-optimization program which includes automated exposure control, adjustment of the mA and/or kV according to patient size and/or use of iterative reconstruction technique. CONTRAST:  84mL OMNIPAQUE IOHEXOL 300 MG/ML  SOLN COMPARISON:  Same day chest radiograph FINDINGS: CT CHEST FINDINGS Cardiovascular: Normal cardiac size.No pericardial disease.Coronary artery calcifications. Normal size main and branch pulmonary  arteries.Mild atherosclerosis of the thoracic aorta. Mediastinum/Nodes: Prominent subcarinal lymph node, nonspecific.No other mediastinal, hilar, or axillary lymphadenopathy.Esophagus is unremarkable.The trachea

## 2022-02-08 NOTE — Progress Notes (Addendum)
Inpatient Diabetes Program Recommendations ? ?AACE/ADA: New Consensus Statement on Inpatient Glycemic Control (2015) ? ?Target Ranges:  Prepandial:   less than 140 mg/dL ?     Peak postprandial:   less than 180 mg/dL (1-2 hours) ?     Critically ill patients:  140 - 180 mg/dL  ? ?Lab Results  ?Component Value Date  ? GLUCAP 164 (H) 02/08/2022  ? HGBA1C 11.2 (H) 02/07/2022  ? ? ?Review of Glycemic Control ? Latest Reference Range & Units 02/07/22 23:57 02/08/22 01:42 02/08/22 03:00 02/08/22 04:06 02/08/22 05:18 02/08/22 07:26  ?Glucose-Capillary 70 - 99 mg/dL 214 (H) 122 (H) 152 (H) 178 (H) 172 (H) 164 (H)  ? ?Diabetes history: DM 2 ?Outpatient Diabetes medications:  ?Glucotrol 10 mg bid- NOT TAKING ?Lantus 12 units daily- NOT TAKING ?Metformin 1000 mg bid (patient cutting in 1/2 to make supply last longer) ?Current orders for Inpatient glycemic control:  ?IV insulin ? ?Inpatient Diabetes Program Recommendations:   ? ?Once patient reading for transition, consider Lantus 22 units 2 hours prior to d/c of insulin drip and Novolog moderate q 4 hours.  Will talk to patient regarding home DM regimen when appropriate.  Last visit with OP provider in chart review was in June of 2021.   ? ?Thanks,  ?Adah Perl, RN, BC-ADM ?Inpatient Diabetes Coordinator ?Pager [phone]  (8a-5p) ? ? ?

## 2022-02-08 NOTE — Procedures (Signed)
Pleural Fibrinolytic Administration Procedure Note ? ?Ashley Mcgee  ?[id number]  ?1970-12-20 ? ?Date:02/08/22  ?Time:2:15 PM  ? ?Provider Performing:Nan Maya D. Harris  ? ?Procedure: Pleural Fibrinolysis Initial day (20947) ? ?Indication(s) ?Fibrinolysis of complicated pleural effusion ? ?Consent ?Risks of the procedure as well as the alternatives and risks of each were explained to the patient and/or caregiver.  Consent for the procedure was obtained. ? ? ?Anesthesia ?None ? ? ?Time Out ?Verified patient identification, verified procedure, site/side was marked, verified correct patient position, special equipment/implants available, medications/allergies/relevant history reviewed, required imaging and test results available. ? ? ?Sterile Technique ?Hand hygiene, gloves ? ? ?Procedure Description ?Existing pleural catheter was cleaned and accessed in sterile manner.  10mg  of tPA in 30cc of saline and 5mg  of dornase in 30cc of sterile water were injected into pleural space using existing pleural catheter.  Catheter will be clamped for 1 hour and then placed back to suction. ? ? ?Complications/Tolerance ?None; patient tolerated the procedure well. ? ?EBL ?None ? ? ?Specimen(s) ?None ? ? ?Ulice Follett D. Harris, NP-C ?[facility] ?Personal contact information can be found on Amion  ?02/08/2022, 2:15 PM ? ? ? ?

## 2022-02-09 ENCOUNTER — Encounter (HOSPITAL_COMMUNITY): Payer: Self-pay | Admitting: Internal Medicine

## 2022-02-09 ENCOUNTER — Inpatient Hospital Stay (HOSPITAL_COMMUNITY): Payer: Medicaid Other

## 2022-02-09 DIAGNOSIS — J189 Pneumonia, unspecified organism: Secondary | ICD-10-CM | POA: Diagnosis not present

## 2022-02-09 LAB — BASIC METABOLIC PANEL
Anion gap: 7 (ref 5–15)
BUN: 26 mg/dL — ABNORMAL HIGH (ref 6–20)
CO2: 24 mmol/L (ref 22–32)
Calcium: 7.5 mg/dL — ABNORMAL LOW (ref 8.9–10.3)
Chloride: 95 mmol/L — ABNORMAL LOW (ref 98–111)
Creatinine, Ser: 0.85 mg/dL (ref 0.44–1.00)
GFR, Estimated: >60 mL/min (ref >60)
Glucose, Bld: 272 mg/dL — ABNORMAL HIGH (ref 70–99)
Potassium: 3.7 mmol/L (ref 3.5–5.1)
Sodium: 126 mmol/L — ABNORMAL LOW (ref 135–145)

## 2022-02-09 LAB — URINE CULTURE: Culture: <10000 — AB

## 2022-02-09 LAB — GLUCOSE, CAPILLARY
Glucose-Capillary: 241 mg/dL — ABNORMAL HIGH (ref 70–99)
Glucose-Capillary: 236 mg/dL — ABNORMAL HIGH (ref 70–99)
Glucose-Capillary: 200 mg/dL — ABNORMAL HIGH (ref 70–99)
Glucose-Capillary: 214 mg/dL — ABNORMAL HIGH (ref 70–99)

## 2022-02-09 LAB — CYTOLOGY - NON PAP

## 2022-02-09 MED ORDER — STERILE WATER FOR INJECTION IJ SOLN
5.0000 mg | Freq: Once | RESPIRATORY_TRACT | Status: AC
  Administered 2022-02-09: 5 mg via INTRAPLEURAL
  Filled 2022-02-09: qty 5

## 2022-02-09 MED ORDER — ENSURE MAX PROTEIN PO LIQD
11.0000 [oz_av] | Freq: Every day | ORAL | Status: DC
  Administered 2022-02-10: 11 [oz_av] via ORAL
  Filled 2022-02-09 (×3): qty 330

## 2022-02-09 MED ORDER — SALINE SPRAY 0.65 % NA SOLN
1.0000 | NASAL | Status: DC | PRN
  Administered 2022-02-09: 1 via NASAL
  Filled 2022-02-09: qty 44

## 2022-02-09 MED ORDER — INSULIN ASPART 100 UNIT/ML IJ SOLN
0.0000 [IU] | Freq: Every day | INTRAMUSCULAR | Status: DC
  Administered 2022-02-09: 2 [IU] via SUBCUTANEOUS

## 2022-02-09 MED ORDER — SODIUM CHLORIDE 0.9 % IV SOLN
3.0000 g | Freq: Four times a day (QID) | INTRAVENOUS | Status: DC
  Administered 2022-02-09 – 2022-02-11 (×8): 3 g via INTRAVENOUS
  Filled 2022-02-09 (×8): qty 8

## 2022-02-09 MED ORDER — LORATADINE 10 MG PO TABS
10.0000 mg | ORAL_TABLET | Freq: Every day | ORAL | Status: DC | PRN
Start: 2022-02-09 — End: 2022-02-11
  Administered 2022-02-09: 10 mg via ORAL
  Filled 2022-02-09: qty 1

## 2022-02-09 MED ORDER — ADULT MULTIVITAMIN W/MINERALS CH
1.0000 | ORAL_TABLET | Freq: Every day | ORAL | Status: DC
  Administered 2022-02-09 – 2022-02-11 (×3): 1 via ORAL
  Filled 2022-02-09 (×3): qty 1

## 2022-02-09 MED ORDER — SODIUM CHLORIDE (PF) 0.9 % IJ SOLN
10.0000 mg | Freq: Once | INTRAMUSCULAR | Status: AC
  Administered 2022-02-09: 10 mg via INTRAPLEURAL
  Filled 2022-02-09: qty 10

## 2022-02-09 MED ORDER — PROSOURCE PLUS PO LIQD
30.0000 mL | Freq: Two times a day (BID) | ORAL | Status: DC
  Administered 2022-02-09 – 2022-02-11 (×4): 30 mL via ORAL
  Filled 2022-02-09 (×4): qty 30

## 2022-02-09 MED ORDER — GLUCERNA SHAKE PO LIQD
237.0000 mL | Freq: Two times a day (BID) | ORAL | Status: DC
  Administered 2022-02-09 – 2022-02-11 (×4): 237 mL via ORAL
  Filled 2022-02-09 (×4): qty 237

## 2022-02-09 MED ORDER — INSULIN ASPART 100 UNIT/ML IJ SOLN
0.0000 [IU] | Freq: Three times a day (TID) | INTRAMUSCULAR | Status: DC
  Administered 2022-02-09: 5 [IU] via SUBCUTANEOUS
  Administered 2022-02-09 – 2022-02-10 (×2): 3 [IU] via SUBCUTANEOUS

## 2022-02-09 NOTE — Plan of Care (Signed)

## 2022-02-09 NOTE — Procedures (Signed)
Pleural Fibrinolytic Administration Procedure Note ? ?Warren Kugelman  ?[id number]  ?[birth year] ? ?Date:02/09/22  ?Time:10:17 AM  ? ?Provider Performing:Audreyana Huntsberry D. Harris  ? ?Procedure: Pleural Fibrinolysis Subsequent day (81448) ? ?Indication(s) ?Fibrinolysis of complicated pleural effusion ? ?Consent ?Risks of the procedure as well as the alternatives and risks of each were explained to the patient and/or caregiver.  Consent for the procedure was obtained. ? ? ?Anesthesia ?None ? ? ?Time Out ?Verified patient identification, verified procedure, site/side was marked, verified correct patient position, special equipment/implants available, medications/allergies/relevant history reviewed, required imaging and test results available. ? ? ?Sterile Technique ?Hand hygiene, gloves ? ? ?Procedure Description ?Existing pleural catheter was cleaned and accessed in sterile manner.  10mg  of tPA in 30cc of saline and 5mg  of dornase in 30cc of sterile water were injected into pleural space using existing pleural catheter.  Catheter will be clamped for 1 hour and then placed back to suction. ? ? ?Complications/Tolerance ?None; patient tolerated the procedure well. ? ?EBL ?None ? ? ?Specimen(s) ?None ? ?Kenly Xiao D. Harris, NP-C ?[facility] ?Personal contact information can be found on Amion  ?02/09/2022, 10:17 AM ? ? ?

## 2022-02-09 NOTE — Progress Notes (Addendum)
Inpatient Diabetes Program Recommendations ? ?AACE/ADA: New Consensus Statement on Inpatient Glycemic Control (2015) ? ?Target Ranges:  Prepandial:   less than 140 mg/dL ?     Peak postprandial:   less than 180 mg/dL (1-2 hours) ?     Critically ill patients:  140 - 180 mg/dL  ? ?Lab Results  ?Component Value Date  ? GLUCAP 236 (H) 02/09/2022  ? HGBA1C 11.2 (H) 02/07/2022  ? ? ?Review of Glycemic Control ? Latest Reference Range & Units 02/08/22 17:12 02/08/22 19:33 02/08/22 20:36 02/08/22 21:18 02/09/22 07:55 02/09/22 11:56  ?Glucose-Capillary 70 - 99 mg/dL 208 (H) 187 (H) 199 (H) 219 (H) 241 (H) 236 (H)  ? ?Diabetes history: DM 2 ?Outpatient Diabetes medications:  ?Metformin 1000 mg bid (patient admits not taking consistently) ?Current orders for Inpatient glycemic control:  ?Novolog moderate tid with meals ?Semglee 22 units daily ?Inpatient Diabetes Program Recommendations:   ? ?Spoke with patient at bedside.  She admits that she was not taking medications as prescribed.  She was taking metformin intermittently but states that she often forgets.  She states that insulin was prescribed to her in the past but she never took it.  She feels comfortable taking insulin and states "I want to get on track".  She states that she administered insulin to her father in the past and feels good administering to herself.   ?Explained that she likely  needs insulin and reviewed A1C.  (This is consistent with A1C in June of 2020) when insulin was prescribed. Patient is agreeable to this and states she wants to do better.  Offered to order he book regarding DM and she states "I won't read it".  Will ask nurses to begin allowing patient to administer her own insulin while in the hospital and review hypoglycemia signs, symptoms and treatment.  Will follow. Needs to f/u at [facility].   ? ?-Consider increasing Semglee to 26 units daily.   ?- Patient will need rx. For insulin, insulin pen needles, and glucose meter at d/c.  ? ?Thanks,   ?Adah Perl, RN, BC-ADM ?Inpatient Diabetes Coordinator ?Pager [phone]  (8a-5p) ? ? ?

## 2022-02-09 NOTE — Discharge Instructions (Signed)

## 2022-02-09 NOTE — Progress Notes (Signed)
Initial Nutrition Assessment ? ?DOCUMENTATION CODES:  ? ?Not applicable ? ?INTERVENTION:  ?- will order Ensure Max once/day, each supplement provides 150 kcal and 30 grams of protein. ?- will order Glucerna Shake BID, each supplement provides 220 kcal and 10 grams of protein. ?- will order order 30 ml Prosource Plus BID, each supplement provides 100 kcal and 15 grams protein.  ?- will order 1 tablet multivitamin with minerals daily. ?- complete NFPE when feasible.  ?- will place Carbohydrate Counting for People with Diabetes handout from the Academy of [employer] and Dietetics to AVS.  ? ? ?NUTRITION DIAGNOSIS:  ? ?Increased nutrient needs related to acute illness as evidenced by estimated needs. ? ?GOAL:  ? ?Patient will meet greater than or equal to 90% of their needs ? ?MONITOR:  ? ?PO intake, Supplement acceptance, Labs, Weight trends ? ?REASON FOR ASSESSMENT:  ? ?Malnutrition Screening Tool ? ?ASSESSMENT:  ? ?51 y.o. 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 She presented to the ED after a fall with resultant shortness of breath, chest pain, and abdominal pain. Patient shared that she has been non-compliant with medication. She was admitted for CAP. ? ?Unable to see patient at time of attempted visit. The only documented meal intake this hospitalization was 5% of breakfast this AM. ? ?She has not been seen by a [facility] RD at any time in the past. ? ?Weight yesterday was 210 lb and PTA the most recently documented weight was 220 lb on 04/03/20. Mild pitting generalized edema documented in the ED section of flow sheet this AM. ? ? ? ?Labs reviewed; HgbA1c: 11.2%, CBGs: 241 and 236 mg/dl, Na: 163 mmol/l, Cl: 95 mmol/l, BUN: 26 mg/dl, Ca: 7.5 mg/dl. ? ?Medications reviewed; sliding scale novolog, 22 units semglee/day. ? ?IVF; D5-LR @ 125 ml/hr (510 kcal/24 hrs). ?  ? ?NUTRITION - FOCUSED PHYSICAL EXAM: ? ?Unable at this  time. ? ?Diet Order:   ?Diet Order   ? ?       ?  Diet Carb Modified Fluid consistency: Thin; Room service appropriate? Yes  Diet effective now       ?  ? ?  ?  ? ?  ? ? ?EDUCATION NEEDS:  ? ?Not appropriate for education at this time ? ?Skin:  Skin Assessment: Reviewed RN Assessment ? ?Last BM:  PTA/unknown ? ?Height:  ? ?Ht Readings from Last 1 Encounters:  ?02/08/22 5\' 9"  (1.753 m)  ? ? ?Weight:  ? ?Wt Readings from Last 1 Encounters:  ?02/08/22 95.2 kg  ? ? ? ?BMI:  Body mass index is 30.99 kg/m?. ? ?Estimated Nutritional Needs:  ?Kcal:  2200-2500 kcal ?Protein:  110-120 grams ?Fluid:  >/= 2.3 L/day ? ? ? ? ?02/10/22, MS, RD, LDN ?Registered Dietitian II ?Inpatient Clinical Nutrition ?RD pager # and on-call/weekend pager # available in AMION  ? ?

## 2022-02-09 NOTE — Progress Notes (Signed)
? ?  NAME:  Ashley Mcgee, MRN:  [id number], DOB:  Sep 24, 1971, LOS: 2 ?ADMISSION DATE:  02/07/2022, CONSULTATION DATE:  02/07/22 ?REFERRING MD:  Karene Fry, CHIEF COMPLAINT:  I've fallen and I can't get up  ? ?History of Present Illness:  ?49yF with history of alcohol use, poorly controlled DM2 with DM neuropathy, HTN, HLD, smoking who was BIBEMS for fall today, she couldn't get up on her own afterward. She has abdominal pain, pleuritic and positional CP, dyspneic with exertion. In ED found to have mild DKA and hyperglycemia as well as loculated R effusion without rib fractures. WBCs of 20, Hb stable relative to prior. Vanc, ceftriaxone, azithromycin ordered. EDP discussed with CVTS on call who recommended involving pulmonary for pigtail and trial of lytics if needed.  ? ?Pertinent  Medical History  ?Etoh use  ?Poorly controlled DM ?HTN ?Smoking ? ?Significant Hospital Events: ?Including procedures, antibiotic start and stop dates in addition to other pertinent events   ?02/07/22 admitted for mild DKA, hyperglycemia and loculated R pleural effusion ?4/17 right pigtail chest tube inserted overnight with of amber colored fluid with significant sediment seen  ?4/18 900 additional mL of output from chest tube post first round of tPA/dornase.  First Maralyn Sago almost full at 1900 mL will exchange at bedside this morning ? ?Interim History / Subjective:  ?Seen lying in bed, reports pain at chest tube site with movement. ? ?Objective   ?Blood pressure (!) 148/76, pulse 100, temperature 98.8 ?F (37.1 ?C), temperature source Oral, resp. rate 18, height 5\' 9"  (1.753 m), weight 95.2 kg, SpO2 91 %. ?   ?   ? ?Intake/Output Summary (Last 24 hours) at 02/09/2022 0853 ?Last data filed at 02/09/2022 0600 ?Gross per 24 hour  ?Intake 606.28 ml  ?Output 700 ml  ?Net -93.72 ml  ? ? ?Filed Weights  ? 02/07/22 1355 02/08/22 2115  ?Weight: 108.9 kg 95.2 kg  ? ?Examination: ?General: Acute on chronic ill-appearing middle-aged female lying in bed  in no acute distress ?HEENT: [place]/AT, MM pink/moist, PERRL,  ?Neuro: Alert and oriented, nonfocal ?CV: s1s2 regular rate and rhythm, no murmur, rubs, or gallops,  ?PULM: Clear to auscultation bilaterally, slightly diminished right base, on room air, chest tube in place ?GI: soft, bowel sounds active in all 4 quadrants, non-tender, non-distended, tolerating oral diet ?Extremities: warm/dry, no edema  ?Skin: no rashes or lesions ? ?Resolved Hospital Problem list   ? ? ?Assessment & Plan:  ?Right loculated pleural effusion ?-CXR on admit revealed right mid to lower lobw opacification with concern for large effusion, S/P pigtail chest tube placement overnight  ?-Positive Light's Criteria ?Leukocytosis with concern for empyema  ?Tobacco use  ?P: ?Repeat second dose of pleural lytics today ?Routine chest tube care ?Daily chest x-ray while chest tube in place ?Exchange chest drainage system today ?Mobilize as able ?Encourage pulmonary hygiene ?Continue antibiotics per primary team ? ?Best Practice (right click and "Reselect all SmartList Selections" daily)  ?Per primary ?  ?Critical care time: NA  ?Dontel Harshberger D. Harris, NP-C ?[facility] ?Personal contact information can be found on Amion  ?02/09/2022, 8:53 AM ? ? ? ? ?  ?

## 2022-02-09 NOTE — Progress Notes (Signed)
Pharmacy Antibiotic Note ? ?Ashley Mcgee is a 51 y.o. female admitted on 02/07/2022 with pneumonia.  Pharmacy has been consulted for unasyn dosing. ? ?Plan: ?Unasyn 3 gr IV q6h  ? ?Height: 5\' 9"  (175.3 cm) ?Weight: 95.2 kg (209 lb 14.1 oz) ?IBW/kg (Calculated) : 66.2 ? ?Temp (24hrs), Avg:99.4 ?F (37.4 ?C), Min:98.5 ?F (36.9 ?C), Max:101.7 ?F (38.7 ?C) ? ?Recent Labs  ?Lab 02/07/22 ?1430 02/07/22 ?1607 02/07/22 ?2108 02/07/22 ?2251 02/07/22 ?2259 02/08/22 ?02/10/22 02/09/22 ?02/11/22  ?WBC 19.9*  --   --   --   --  15.5*  --   ?CREATININE 1.41* 1.25* 1.02* 1.07*  --  0.97 0.85  ?LATICACIDVEN  --   --  1.1  --  1.5  --   --   ?  ?Estimated Creatinine Clearance: 94 mL/min (by C-G formula based on SCr of 0.85 mg/dL).   ? ?No Known Allergies ? ? ? ?Dosage will likely remain stable at above dose  and need for further dosage adjustment appears unlikely at present.   ? ?Will sign off at this time.  Please reconsult if a change in clinical status warrants re-evaluation of dosage. ? ? ?Thank you for allowing pharmacy to be a part of this patient?s care. ? ? ?[id number], PharmD, BCPS ?02/09/2022 12:25 PM ? ? ?

## 2022-02-09 NOTE — Progress Notes (Signed)
? ?PROGRESS NOTE ? ?Ashley Mcgee [id number] DOB: [birth year] DOA: 02/07/2022 ?PCP: Cain SaupeFulp, Cammie, MD ? ?HPI/Recap of past 24 hours: ?Per H&P dictated by Dr. Maisie Fushomas: "Ashley Mcgee is a 51 y.o. female with medical history significant of history of alcohol abuse, poorly-controlled T2DM with diabetic neuropathy, HTN, HLD, and tobacco use who presents to ED BIB EMS s/p fall with complaints of sob, chest pain/abdominal pain. In the field patient blood sugar was noted to be over 500. Patient does admit to not being compliant with her medication as of late. Patient states that she has been ill for the last 3-4 days. She noted however no associated /f/c/ n/v but did note sob and generalized malaise. She denies any sick contact of productive cough." ? ?02/09/2022: Patient was seen at her bedside.  She has moderate pain on her right side at the site of tube insertion.  PCCM assisted with the management of her chest tube. ? ?Assessment/Plan: ?Principal Problem: ?  CAP (community acquired pneumonia) ? ?CAP with right-sided loculated empyema associated Acute hypoxic respiratory failure post chest tube placement by PCCM ?Appreciate PCCM assistance.  1100 cc of amber-colored fluid with significant sediment seen. ?Initially received cefepime and azithromycin, narrowed down to Unasyn on 02/09/2022. ?Fluid culture reintubated for better growth. ?Post intrapleural fibrinolytic on 02/08/2022 and 02/09/2022. ?Management per PCCM ? ?Hypervolemic hyponatremia ?DC IV fluid ?Encourage oral intake ?Repeat BMP in the morning ? ?Type 2 diabetes with hyperglycemia ?Hemoglobin A1c 11.2 on 02/07/2022 ?Continue insulin coverage ? ?Sinus tachycardia ?Likely contributed by lungs physiology. ?Continue to treat underlying conditions ?As needed low-dose Lopressor with parameters ?  ?HTN, uncontrolled. ?BPs improved after restarting home Norvasc. ?Per medical records she is not on oral antihypertensives prior to admission. ?Continue Norvasc 5 mg  daily. ?Continue to closely monitor vital signs. ? ?Tobacco abuse ?Continue to encourage cessation  ?  ?DVT prophylaxis: SCD  ?Code Status: FULL ?Family Communication: None at bedside. ?Disposition Plan: patient  expected to be admitted greater than 2 midnights  ?Consults called: critical care Catha GosselinIsmail MD ? ? ? ?Status is: Inpatient ?Patient requires at least 2 midnights for further evaluation and treatment of present condition. ? ? ? ?Objective: ?Vitals:  ? 02/09/22 0200 02/09/22 [id number] 02/09/22 1051 02/09/22 1429  ?BP: 118/66 (!) 148/76 (!) 135/93 137/73  ?Pulse: (!) 102 100 (!) 109 (!) 110  ?Resp: (!) 21 18 (!) 22 19  ?Temp:  98.8 ?F (37.1 ?C) 98.5 ?F (36.9 ?C) 99.1 ?F (37.3 ?C)  ?TempSrc:  Oral Oral Oral  ?SpO2: 91% 91% 97% (!) 89%  ?Weight:      ?Height:      ? ? ?Intake/Output Summary (Last 24 hours) at 02/09/2022 1707 ?Last data filed at 02/09/2022 1100 ?Gross per 24 hour  ?Intake 666.28 ml  ?Output 150 ml  ?Net 516.28 ml  ? ?Filed Weights  ? 02/07/22 1355 02/08/22 2115  ?Weight: 108.9 kg 95.2 kg  ? ? ?Exam: ? ?General: 51 y.o. year-old female well-developed nourished in O2 sats.  She is alert oriented x3.   ?Cardiovascular: Tachycardic with no rubs or gallops.   ?Respiratory: Mild rales at bases.  No wheezing noted.  Chest tube in the right side chest.   ?Abdomen: Soft normal bowel sounds present. ?Musculoskeletal: Trace lower extremity edema bilaterally. ?Skin: No ulcerative lesions noted. ?Psychiatry: Mood is appropriate for condition. ? ? ?Data Reviewed: ?CBC: ?Recent Labs  ?Lab 02/07/22 ?1430 02/08/22 [id number]  ?WBC 19.9* 15.5*  ?NEUTROABS 17.3* 12.4*  ?HGB 10.2* 9.9*  ?HCT 30.3*  29.0*  ?MCV 96.5 95.7  ?PLT 248 237  ? ?Basic Metabolic Panel: ?Recent Labs  ?Lab 02/07/22 ?1607 02/07/22 ?2108 02/07/22 ?2251 02/08/22 ?2633 02/09/22 ?3545  ?NA 127* 116* 130* 131* 126*  ?K 4.3 3.6 3.8 3.4* 3.7  ?CL 93* 85* 97* 99 95*  ?CO2 22 18* 21* 25 24  ?GLUCOSE 450* 816* 336* 264* 272*  ?BUN 23* 20 23* 22* 26*  ?CREATININE 1.25*  1.02* 1.07* 0.97 0.85  ?CALCIUM 8.0* 7.0* 8.0* 8.1* 7.5*  ?MG  --   --  2.0  --   --   ?PHOS  --   --  2.0*  --   --   ? ?GFR: ?Estimated Creatinine Clearance: 94 mL/min (by C-G formula based on SCr of 0.85 mg/dL). ?Liver Function Tests: ?Recent Labs  ?Lab 02/07/22 ?1430 02/07/22 ?2251 02/08/22 ?6256  ?AST 12* 12* 13*  ?ALT 9 10 9   ?ALKPHOS 67 58 51  ?BILITOT 0.5 1.1 0.5  ?PROT 6.6 6.1* 5.8*  ?ALBUMIN 2.8* 2.6* 2.3*  ? ?Recent Labs  ?Lab 02/07/22 ?1430  ?LIPASE 24  ? ?No results for input(s): AMMONIA in the last 168 hours. ?Coagulation Profile: ?Recent Labs  ?Lab 02/07/22 ?2251  ?INR 1.3*  ? ?Cardiac Enzymes: ?No results for input(s): CKTOTAL, CKMB, CKMBINDEX, TROPONINI in the last 168 hours. ?BNP (last 3 results) ?No results for input(s): PROBNP in the last 8760 hours. ?HbA1C: ?Recent Labs  ?  02/07/22 ?2252  ?HGBA1C 11.2*  ? ?CBG: ?Recent Labs  ?Lab 02/08/22 ?1933 02/08/22 ?2036 02/08/22 ?2118 02/09/22 ?0755 02/09/22 ?1156  ?GLUCAP 187* 199* 219* 241* 236*  ? ?Lipid Profile: ?No results for input(s): CHOL, HDL, LDLCALC, TRIG, CHOLHDL, LDLDIRECT in the last 72 hours. ?Thyroid Function Tests: ?Recent Labs  ?  02/07/22 ?2252  ?TSH 0.733  ? ?Anemia Panel: ?No results for input(s): VITAMINB12, FOLATE, FERRITIN, TIBC, IRON, RETICCTPCT in the last 72 hours. ?Urine analysis: ?   ?Component Value Date/Time  ? COLORURINE YELLOW 02/08/2022 0649  ? APPEARANCEUR CLOUDY (A) 02/08/2022 0649  ? APPEARANCEUR Clear 03/21/2014 1209  ? LABSPEC 1.029 02/08/2022 0649  ? LABSPEC 1.015 03/21/2014 1209  ? PHURINE 5.0 02/08/2022 0649  ? GLUCOSEU >=500 (A) 02/08/2022 0649  ? GLUCOSEU >=500 03/21/2014 1209  ? HGBUR NEGATIVE 02/08/2022 0649  ? BILIRUBINUR NEGATIVE 02/08/2022 0649  ? BILIRUBINUR negative 12/25/2018 1413  ? BILIRUBINUR Negative 03/21/2014 1209  ? KETONESUR 5 (A) 02/08/2022 02/10/2022  ? PROTEINUR 30 (A) 02/08/2022 02/10/2022  ? UROBILINOGEN 0.2 12/25/2018 1413  ? NITRITE NEGATIVE 02/08/2022 0649  ? LEUKOCYTESUR NEGATIVE 02/08/2022 0649  ?  LEUKOCYTESUR Negative 03/21/2014 1209  ? ?Sepsis Labs: ?@LABRCNTIP (procalcitonin:4,lacticidven:4) ? ?) ?Recent Results (from the past 240 hour(s))  ?Body fluid culture w Gram Stain     Status: None (Preliminary result)  ? Collection Time: 02/07/22  8:55 PM  ? Specimen: Pleural Fluid  ?Result Value Ref Range Status  ? Specimen Description   Final  ?  PLEURAL ?Performed at [facility], [address] [place], [place] ?  ? Special Requests   Final  ?  NONE ?Performed at [facility], [address] [place], [place] ?  ? Gram Stain   Final  ?  MODERATE WBC PRESENT,BOTH PMN AND MONONUCLEAR ?NO ORGANISMS SEEN ?  ? Culture   Final  ?  CULTURE REINCUBATED FOR BETTER GROWTH ?Performed at [facility], [address], [place], [address] [place] ?  ? Report Status PENDING  Incomplete  ?  Urine Culture     Status: Abnormal  ? Collection Time: 02/08/22  6:49 AM  ? Specimen: Urine, Clean Catch  ?Result Value Ref Range Status  ? Specimen Description   Final  ?  URINE, CLEAN CATCH ?Performed at [facility], [address] [place], [place] [address] ?  ? Special Requests   Final  ?  NONE ?Performed at [facility], [address] [place], [place] [address] ?  ? Culture (A)  Final  ?  <10,000 COLONIES/mL INSIGNIFICANT GROWTH ?Performed at [facility], [address], [place], [place] [address] ?  ? Report Status 02/09/2022 FINAL  Final  ?  ? ? ?Studies: ?DG CHEST PORT 1 VIEW ? ?Result Date: 02/09/2022 ?CLINICAL DATA:  Right chest tube follow-up. Small hydropneumothorax. EXAM: PORTABLE CHEST 1 VIEW COMPARISON:  02/07/2022 at 8:20 p.m. chest CT 02/07/2022 3:22 p.m. FINDINGS: 4:12 a.m., 02/09/2022. Pigtail right chest tube positioning is unchanged with the pigtail tip superimposing mid field. There previously was a small basolateral right hydropneumothorax with loculation. This is not seen today and may have filled with fluid  in the interval. There is no apical pneumothorax. There are patchy opacities of the right lower lung field probably reflect mostly a combination of atelectasis and superimposed loculated fluid pockets. [place] Slider

## 2022-02-10 ENCOUNTER — Inpatient Hospital Stay (HOSPITAL_COMMUNITY): Payer: Medicaid Other

## 2022-02-10 DIAGNOSIS — J869 Pyothorax without fistula: Secondary | ICD-10-CM | POA: Diagnosis not present

## 2022-02-10 DIAGNOSIS — E871 Hypo-osmolality and hyponatremia: Secondary | ICD-10-CM

## 2022-02-10 DIAGNOSIS — I10 Essential (primary) hypertension: Secondary | ICD-10-CM

## 2022-02-10 DIAGNOSIS — J189 Pneumonia, unspecified organism: Secondary | ICD-10-CM | POA: Diagnosis not present

## 2022-02-10 DIAGNOSIS — E1142 Type 2 diabetes mellitus with diabetic polyneuropathy: Secondary | ICD-10-CM | POA: Diagnosis not present

## 2022-02-10 DIAGNOSIS — R000 Tachycardia, unspecified: Secondary | ICD-10-CM

## 2022-02-10 DIAGNOSIS — E118 Type 2 diabetes mellitus with unspecified complications: Secondary | ICD-10-CM

## 2022-02-10 DIAGNOSIS — A419 Sepsis, unspecified organism: Secondary | ICD-10-CM | POA: Diagnosis not present

## 2022-02-10 LAB — GLUCOSE, CAPILLARY
Glucose-Capillary: 199 mg/dL — ABNORMAL HIGH (ref 70–99)
Glucose-Capillary: 181 mg/dL — ABNORMAL HIGH (ref 70–99)
Glucose-Capillary: 220 mg/dL — ABNORMAL HIGH (ref 70–99)
Glucose-Capillary: 306 mg/dL — ABNORMAL HIGH (ref 70–99)

## 2022-02-10 LAB — BASIC METABOLIC PANEL
Anion gap: 6 (ref 5–15)
BUN: 22 mg/dL — ABNORMAL HIGH (ref 6–20)
CO2: 24 mmol/L (ref 22–32)
Calcium: 7.7 mg/dL — ABNORMAL LOW (ref 8.9–10.3)
Chloride: 98 mmol/L (ref 98–111)
Creatinine, Ser: 0.77 mg/dL (ref 0.44–1.00)
GFR, Estimated: >60 mL/min (ref >60)
Glucose, Bld: 218 mg/dL — ABNORMAL HIGH (ref 70–99)
Potassium: 3.5 mmol/L (ref 3.5–5.1)
Sodium: 128 mmol/L — ABNORMAL LOW (ref 135–145)

## 2022-02-10 LAB — CBC WITH DIFFERENTIAL/PLATELET
Abs Immature Granulocytes: 0.06 10*3/uL (ref 0.00–0.07)
Basophils Absolute: 0 10*3/uL (ref 0.0–0.1)
Basophils Relative: 0 %
Eosinophils Absolute: 0.1 10*3/uL (ref 0.0–0.5)
Eosinophils Relative: 1 %
HCT: 29.8 % — ABNORMAL LOW (ref 36.0–46.0)
Hemoglobin: 9.8 g/dL — ABNORMAL LOW (ref 12.0–15.0)
Immature Granulocytes: 1 %
Lymphocytes Relative: 11 %
Lymphs Abs: 1.2 10*3/uL (ref 0.7–4.0)
MCH: 32 pg (ref 26.0–34.0)
MCHC: 32.9 g/dL (ref 30.0–36.0)
MCV: 97.4 fL (ref 80.0–100.0)
Monocytes Absolute: 0.9 10*3/uL (ref 0.1–1.0)
Monocytes Relative: 8 %
Neutro Abs: 8.8 10*3/uL — ABNORMAL HIGH (ref 1.7–7.7)
Neutrophils Relative %: 79 %
Platelets: 234 10*3/uL (ref 150–400)
RBC: 3.06 MIL/uL — ABNORMAL LOW (ref 3.87–5.11)
RDW: 12.1 % (ref 11.5–15.5)
WBC: 11 10*3/uL — ABNORMAL HIGH (ref 4.0–10.5)
nRBC: 0 % (ref 0.0–0.2)

## 2022-02-10 LAB — PHOSPHORUS: Phosphorus: 2.5 mg/dL (ref 2.5–4.6)

## 2022-02-10 LAB — MAGNESIUM: Magnesium: 2 mg/dL (ref 1.7–2.4)

## 2022-02-10 MED ORDER — GABAPENTIN 300 MG PO CAPS
300.0000 mg | ORAL_CAPSULE | Freq: Three times a day (TID) | ORAL | Status: DC
  Administered 2022-02-10 – 2022-02-11 (×4): 300 mg via ORAL
  Filled 2022-02-10 (×4): qty 1

## 2022-02-10 MED ORDER — INSULIN ASPART 100 UNIT/ML IJ SOLN
0.0000 [IU] | Freq: Three times a day (TID) | INTRAMUSCULAR | Status: DC
  Administered 2022-02-10: 4 [IU] via SUBCUTANEOUS
  Administered 2022-02-10: 7 [IU] via SUBCUTANEOUS
  Administered 2022-02-11: 20 [IU] via SUBCUTANEOUS
  Administered 2022-02-11: 11 [IU] via SUBCUTANEOUS

## 2022-02-10 MED ORDER — INSULIN ASPART 100 UNIT/ML IJ SOLN
6.0000 [IU] | Freq: Three times a day (TID) | INTRAMUSCULAR | Status: DC
  Administered 2022-02-11: 6 [IU] via SUBCUTANEOUS

## 2022-02-10 MED ORDER — STERILE WATER FOR INJECTION IJ SOLN
5.0000 mg | Freq: Once | RESPIRATORY_TRACT | Status: AC
  Administered 2022-02-10: 5 mg via INTRAPLEURAL
  Filled 2022-02-10: qty 5

## 2022-02-10 MED ORDER — SODIUM CHLORIDE (PF) 0.9 % IJ SOLN
10.0000 mg | Freq: Once | INTRAMUSCULAR | Status: AC
  Administered 2022-02-10: 10 mg via INTRAPLEURAL
  Filled 2022-02-10: qty 10

## 2022-02-10 MED ORDER — INSULIN ASPART 100 UNIT/ML IJ SOLN
0.0000 [IU] | Freq: Every day | INTRAMUSCULAR | Status: DC
  Administered 2022-02-10: 4 [IU] via SUBCUTANEOUS

## 2022-02-10 MED ORDER — FENTANYL CITRATE PF 50 MCG/ML IJ SOSY
50.0000 ug | PREFILLED_SYRINGE | Freq: Once | INTRAMUSCULAR | Status: AC
  Administered 2022-02-10: 50 ug via INTRAVENOUS
  Filled 2022-02-10: qty 1

## 2022-02-10 MED ORDER — ATORVASTATIN CALCIUM 40 MG PO TABS
40.0000 mg | ORAL_TABLET | Freq: Every day | ORAL | Status: DC
Start: 2022-02-10 — End: 2022-02-11
  Administered 2022-02-10 – 2022-02-11 (×2): 40 mg via ORAL
  Filled 2022-02-10 (×2): qty 1

## 2022-02-10 NOTE — Plan of Care (Signed)

## 2022-02-10 NOTE — Procedures (Signed)
Pleural Fibrinolytic Administration Procedure Note ? ?Ashley Mcgee  ?[id number]  ?08/16/1971 ? ?Date:02/10/22  ?Time:11:32 AM  ? ?Provider Performing:Kerby Hockley D. Harris  ? ?Procedure: Pleural Fibrinolysis Subsequent day (40347) ? ?Indication(s) ?Fibrinolysis of complicated pleural effusion ? ?Consent ?Risks of the procedure as well as the alternatives and risks of each were explained to the patient and/or caregiver.  Consent for the procedure was obtained. ? ? ?Anesthesia ?None ? ? ?Time Out ?Verified patient identification, verified procedure, site/side was marked, verified correct patient position, special equipment/implants available, medications/allergies/relevant history reviewed, required imaging and test results available. ? ? ?Sterile Technique ?Hand hygiene, gloves ? ? ?Procedure Description ?Existing pleural catheter was cleaned and accessed in sterile manner.  10mg  of tPA in 30cc of saline and 5mg  of dornase in 30cc of sterile water were injected into pleural space using existing pleural catheter.  Catheter will be clamped for 1 hour and then placed back to suction. ? ? ?Complications/Tolerance ?None; patient tolerated the procedure well. ? ?EBL ?None ? ? ?Specimen(s) ?None ? ?Ashley Mcgee D. Harris, NP-C ?[facility] ?Personal contact information can be found on Amion  ?02/10/2022, 11:32 AM ? ? ?

## 2022-02-10 NOTE — TOC Progression Note (Signed)
Transition of Care (TOC) - Progression Note  ? ? ?Patient Details  ?Name: Ashley Mcgee ?MRN: [id number] ?Date of Birth: 03/10/1971 ? ?Transition of Care (TOC) CM/SW Contact  ?Geni Bers, RN ?Phone Number: ?02/10/2022, 3:45 PM ? ?Clinical Narrative:    ?Pt has Medicaid Insurance unable to assist with Medications.  ? ? ?Expected Discharge Plan: Home/Self Care ?Barriers to Discharge: No Barriers Identified ? ?Expected Discharge Plan and Services ?Expected Discharge Plan: Home/Self Care ?  ?  ?  ?Living arrangements for the past 2 months: Single Family Home ?                ?  ?  ?  ?  ?  ?  ?  ?  ?  ?  ? ? ?Social Determinants of Health (SDOH) Interventions ?Alcohol Brief Interventions/Follow-up: Alcohol education/Brief advice ? ?Readmission Risk Interventions ?   ? View : No data to display.  ?  ?  ?  ? ? ?

## 2022-02-10 NOTE — Progress Notes (Signed)
? ?  NAME:  Ashley Mcgee, MRN:  [id number], DOB:  [birth year], LOS: 3 ?ADMISSION DATE:  02/07/2022, CONSULTATION DATE:  02/07/22 ?REFERRING MD:  Karene Fry, CHIEF COMPLAINT:  I've fallen and I can't get up  ? ?History of Present Illness:  ?49yF with history of alcohol use, poorly controlled DM2 with DM neuropathy, HTN, HLD, smoking who was BIBEMS for fall today, she couldn't get up on her own afterward. She has abdominal pain, pleuritic and positional CP, dyspneic with exertion. In ED found to have mild DKA and hyperglycemia as well as loculated R effusion without rib fractures. WBCs of 20, Hb stable relative to prior. Vanc, ceftriaxone, azithromycin ordered. EDP discussed with CVTS on call who recommended involving pulmonary for pigtail and trial of lytics if needed.  ? ?Pertinent  Medical History  ?Etoh use  ?Poorly controlled DM ?HTN ?Smoking ? ?Significant Hospital Events: ?Including procedures, antibiotic start and stop dates in addition to other pertinent events   ?02/07/22 admitted for mild DKA, hyperglycemia and loculated R pleural effusion ?4/17 right pigtail chest tube inserted overnight with of amber colored fluid with significant sediment seen  ?4/18 900 additional mL of output from chest tube post first round of tPA/dornase.  First Maralyn Sago almost full at 1900 mL will exchange at bedside this morning. Second dose of lytics given  ?4/19 No issues overnight, additional pleural fluid removed post second dose of pleural lytics  ? ?Interim History / Subjective:  ?States she feels will, reports mild pain near chest tube site  ? ?Objective   ?Blood pressure 124/65, pulse (!) 103, temperature 100 ?F (37.8 ?C), temperature source Oral, resp. rate 20, height 5\' 9"  (1.753 m), weight 95.2 kg, SpO2 97 %. ?   ?   ? ?Intake/Output Summary (Last 24 hours) at 02/10/2022 0733 ?Last data filed at 02/10/2022 0410 ?Gross per 24 hour  ?Intake 192.44 ml  ?Output 550 ml  ?Net -357.56 ml  ? ? ?Filed Weights  ? 02/07/22 1355  02/08/22 2115  ?Weight: 108.9 kg 95.2 kg  ? ?Examination: ?General: Acute on chronically ill appearing middle aged female lying in bed in NAD ?HEENT: [place]/AT, MM pink/moist, PERRL,  ?Neuro: Alert and oriented x3 ?CV: s1s2 regular rate and rhythm, no murmur, rubs, or gallops,  ?PULM:  Clear to ascultation, no increased work of breathing, no added breath sounds, chest tube in place ?GI: soft, bowel sounds active in all 4 quadrants, non-tender, non-distended, tolerating oral diet ?Extremities: warm/dry, no  edema  ?Skin: no rashes or lesions ? ?Resolved Hospital Problem list   ? ? ?Assessment & Plan:  ?Right loculated pleural effusion ?-CXR on admit revealed right mid to lower lobw opacification with concern for large effusion, S/P pigtail chest tube placement overnight  ?-Positive Light's Criteria ?Leukocytosis with concern for empyema  ?Tobacco use  ?P: ?Continue to monitor chest tube output, 2116 out in the last 24hrs  ?Repeat last dose of pleural lytics today  ?Routine chest tube care  ?Daily chest xray while chest tube is in place  ?Mobilize as able  ?Encourage pulmonary hygiene ?Antibiotics downgraded to Unasyn 4/19, can likely swap to PO Augmentin soon  ? ?Best Practice (right click and "Reselect all SmartList Selections" daily)  ?Per primary ?  ?Critical care time: NA  ?Awesome Jared D. Harris, NP-C ?[facility] ?Personal contact information can be found on Amion  ?02/10/2022, 7:33 AM ? ? ? ? ?  ?

## 2022-02-10 NOTE — Progress Notes (Signed)
The nurse tech attempted to bathe the pt but she refused. ?I went in after to speak with the pt regarding having a bath and she still refused saying she's too uncomfortable, due the chest tube, to be cleaned up. ?

## 2022-02-10 NOTE — Progress Notes (Signed)
[facility] ?PROGRESS NOTE ? ? ? ?Progress Note  ?Ashley Mcgee  QAS:[id number] DOB: Jul 11, 1971 DOA: 02/07/2022 ?PCP: Cain Saupe, MD  ? ? ? ?Brief Narrative:  ? ?Ashley Mcgee is an 51 y.o. female past medical history of alcohol abuse, poorly controlled diabetes mellitus with diabetic neuropathy, essential hypertension tobacco abuse brought into the ED by EMS complaining of shortness of breath chest pain x-ray showed right to middle lobe opacification, CT scan of the abdomen pelvis did show a large multiloculated right pleural effusion concerning for exudate and possible empyema.  Pulmonary and critical care was consulted, chest tube was placed and they started him on Lytics ? ? ?Assessment/Plan:  ? ?Right-sided  CAP /  Empyema of right pleural space (HCC) ?Initially started on cefepime and azithromycin, de-escalated to IV Unasyn. ?Pulmonary and critical care was consulted and chest tube was placed status post fibrinolytics on 17, 18th and 19th. ?Tmax of 100 this morning, leukocytosis is improved continue  IV Unasyn. ?Appreciate pulmonary's assistance. ? ?Hyponatremia ?DC IV fluids likely due to primary pulmonary problem.  Slowly trending up. ? ?Diabetic peripheral neuropathy (HCC/  Type 2 diabetes with complication (HCC) ?Last A1c 11.2, blood glucose remains elevated currently on long-acting insulin as well as sliding scale insulin resistant scale with meal coverage. ?Continue CBGs before meals and at bedtime. ?Resume Neurontin. ? ?Sinus tachycardia ?Slowly improving likely due to primary pulmonary problem. ? ?Essential hypertension ?Continue to hold antihypertensive medication blood pressure seems to be well controlled. ? ?DVT prophylaxis: lovenox ?Family Communication:none ?Status is: Inpatient ?Remains inpatient appropriate because: Empyema with community-acquired pneumonia ? ? ? ?Code Status:  ? ?  ?Code Status Orders  ?(From admission, onward)  ?  ? ? ?  ? ?  Start     Ordered  ? 02/07/22 2205  Full  code  Continuous       ? 02/07/22 2212  ? ?  ?  ? ?  ? ?Code Status History   ? ? Date Active Date Inactive Code Status Order ID Comments User Context  ? 02/07/2022 2212 02/07/2022 2212 Full Code [id number]  Lurline Del, MD ED  ? 03/22/2020 1851 03/25/2020 1926 Full Code [id number]  Tyrone Nine, MD Inpatient  ? ?  ? ? ? ? ?IV Access:  ? ?Peripheral IV ? ? ?Procedures and diagnostic studies:  ? ?DG CHEST PORT 1 VIEW ? ?Result Date: 02/10/2022 ?CLINICAL DATA:  Assess for chest tube EXAM: PORTABLE CHEST 1 VIEW COMPARISON:  February 09, 2021 FINDINGS: Right lower hemithorax chest tube is unchanged compared prior exam. Patchy consolidation of the right lung base is identified slightly improved. The left lung is clear. No pleural line is identified. The mediastinal contour and cardiac silhouette are normal. Osseous structures are stable. IMPRESSION: Right lower hemithorax chest tube is unchanged compared prior exam. No pneumothorax. Patchy consolidation of the right lung base slightly improved. Electronically Signed   By: Sherian Rein M.D.   On: 02/10/2022 07:09  ? ?DG CHEST PORT 1 VIEW ? ?Result Date: 02/09/2022 ?CLINICAL DATA:  Right chest tube follow-up. Small hydropneumothorax. EXAM: PORTABLE CHEST 1 VIEW COMPARISON:  02/07/2022 at 8:20 p.m. chest CT 02/07/2022 3:22 p.m. FINDINGS: 4:12 a.m., 02/09/2022. Pigtail right chest tube positioning is unchanged with the pigtail tip superimposing mid field. There previously was a small basolateral right hydropneumothorax with loculation. This is not seen today and may have filled with fluid in the interval. There is no apical pneumothorax. There are patchy opacities of the right lower  lung field probably reflect mostly a combination of atelectasis and superimposed loculated fluid pockets. Underlying pneumonic infiltrate is possible. The basal opacities are slightly improved, minimally less confluent but otherwise unchanged. There is a stable left suprahilar opacity which on CT  was in the superior segment left lower lobe. The remaining lungs are generally clear. The cardiac size is stable. The mediastinum is stable. IMPRESSION: 1. Small loculated right lateral basal hydropneumothorax is not seen today and may have filled with fluid since April 16. 2. Patchy opacities of the right lower lung field, slightly improved. Stable left suprahilar opacity. 3. Some of the right lower zonal opacities probably reflect loculated pleural fluid judging from chest CT on April 16. Electronically Signed   By: Almira Bar M.D.   On: 02/09/2022 06:37   ? ? ?Medical Consultants:  ? ?None. ? ? ?Subjective:  ? ? ?Ashley Mcgee she relates she does not feel good but her pain is improving. ? ?Objective:  ? ? ?Vitals:  ? 02/09/22 1051 02/09/22 1429 02/09/22 2228 02/10/22 0607  ?BP: (!) 135/93 137/73 132/84 124/65  ?Pulse: (!) 109 (!) 110 (!) 105 (!) 103  ?Resp: (!) 22 19 18 20   ?Temp: 98.5 ?F (36.9 ?C) 99.1 ?F (37.3 ?C) 100 ?F (37.8 ?C)   ?TempSrc: Oral Oral Oral   ?SpO2: 97% (!) 89% 96% 97%  ?Weight:      ?Height:      ? ?SpO2: 97 % ?O2 Flow Rate (L/min): 2.5 L/min ? ? ?Intake/Output Summary (Last 24 hours) at 02/10/2022 0956 ?Last data filed at 02/10/2022 0410 ?Gross per 24 hour  ?Intake 192.44 ml  ?Output 550 ml  ?Net -357.56 ml  ? ?Filed Weights  ? 02/07/22 1355 02/08/22 2115  ?Weight: 108.9 kg 95.2 kg  ? ? ?Exam: ?General exam: In no acute distress. ?Respiratory system: Good air movement and clear to auscultation, chest tube in place ?Cardiovascular system: S1 & S2 heard, RRR. No JVD. ?Gastrointestinal system: Abdomen is nondistended, soft and nontender.  ?Extremities: No pedal edema. ?Skin: No rashes, lesions or ulcers ?Psychiatry: Judgement and insight appear normal. Mood & affect appropriate.  ? ? ?Data Reviewed:  ? ? ?Labs: ?Basic Metabolic Panel: ?Recent Labs  ?Lab 02/07/22 ?2108 02/07/22 ?2251 02/08/22 ?02/10/22 02/09/22 ?02/11/22 02/10/22 ?0422  ?NA 116* 130* 131* 126* 128*  ?K 3.6 3.8 3.4* 3.7 3.5  ?CL 85*  97* 99 95* 98  ?CO2 18* 21* 25 24 24   ?GLUCOSE 816* 336* 264* 272* 218*  ?BUN 20 23* 22* 26* 22*  ?CREATININE 1.02* 1.07* 0.97 0.85 0.77  ?CALCIUM 7.0* 8.0* 8.1* 7.5* 7.7*  ?MG  --  2.0  --   --  2.0  ?PHOS  --  2.0*  --   --  2.5  ? ?GFR ?Estimated Creatinine Clearance: 99.9 mL/min (by C-G formula based on SCr of 0.77 mg/dL). ?Liver Function Tests: ?Recent Labs  ?Lab 02/07/22 ?1430 02/07/22 ?2251 02/08/22 ?02/09/22  ?AST 12* 12* 13*  ?ALT 9 10 9   ?ALKPHOS 67 58 51  ?BILITOT 0.5 1.1 0.5  ?PROT 6.6 6.1* 5.8*  ?ALBUMIN 2.8* 2.6* 2.3*  ? ?Recent Labs  ?Lab 02/07/22 ?1430  ?LIPASE 24  ? ?No results for input(s): AMMONIA in the last 168 hours. ?Coagulation profile ?Recent Labs  ?Lab 02/07/22 ?2251  ?INR 1.3*  ? ?COVID-19 Labs ? ?No results for input(s): DDIMER, FERRITIN, LDH, CRP in the last 72 hours. ? ?Lab Results  ?Component Value Date  ? SARSCOV2NAA NEGATIVE 03/22/2020  ? ? ?  CBC: ?Recent Labs  ?Lab 02/07/22 ?1430 02/08/22 ?[id number] 02/10/22 ?0422  ?WBC 19.9* 15.5* 11.0*  ?NEUTROABS 17.3* 12.4* 8.8*  ?HGB 10.2* 9.9* 9.8*  ?HCT 30.3* 29.0* 29.8*  ?MCV 96.5 95.7 97.4  ?PLT 248 237 234  ? ?Cardiac Enzymes: ?No results for input(s): CKTOTAL, CKMB, CKMBINDEX, TROPONINI in the last 168 hours. ?BNP (last 3 results) ?No results for input(s): PROBNP in the last 8760 hours. ?CBG: ?Recent Labs  ?Lab 02/09/22 ?0755 02/09/22 ?1156 02/09/22 ?1724 02/09/22 ?2222 02/10/22 ?[id number]  ?GLUCAP 241* 236* 200* 214* 199*  ? ?D-Dimer: ?No results for input(s): DDIMER in the last 72 hours. ?Hgb A1c: ?Recent Labs  ?  02/07/22 ?2252  ?HGBA1C 11.2*  ? ?Lipid Profile: ?No results for input(s): CHOL, HDL, LDLCALC, TRIG, CHOLHDL, LDLDIRECT in the last 72 hours. ?Thyroid function studies: ?Recent Labs  ?  02/07/22 ?2252  ?TSH 0.733  ? ?Anemia work up: ?No results for input(s): VITAMINB12, FOLATE, FERRITIN, TIBC, IRON, RETICCTPCT in the last 72 hours. ?Sepsis Labs: ?Recent Labs  ?Lab 02/07/22 ?1430 02/07/22 ?2108 02/07/22 ?2251 02/07/22 ?2259 02/08/22 ?[id number]  02/10/22 ?0422  ?PROCALCITON  --   --  6.89  --   --   --   ?WBC 19.9*  --   --   --  15.5* 11.0*  ?LATICACIDVEN  --  1.1  --  1.5  --   --   ? ?Microbiology ?Recent Results (from the past 240 hour(s))  ?

## 2022-02-11 DIAGNOSIS — E1142 Type 2 diabetes mellitus with diabetic polyneuropathy: Secondary | ICD-10-CM | POA: Diagnosis not present

## 2022-02-11 DIAGNOSIS — E1165 Type 2 diabetes mellitus with hyperglycemia: Secondary | ICD-10-CM

## 2022-02-11 DIAGNOSIS — J189 Pneumonia, unspecified organism: Secondary | ICD-10-CM | POA: Diagnosis not present

## 2022-02-11 DIAGNOSIS — J869 Pyothorax without fistula: Secondary | ICD-10-CM | POA: Diagnosis not present

## 2022-02-11 DIAGNOSIS — A419 Sepsis, unspecified organism: Secondary | ICD-10-CM | POA: Diagnosis not present

## 2022-02-11 DIAGNOSIS — N179 Acute kidney failure, unspecified: Secondary | ICD-10-CM | POA: Diagnosis not present

## 2022-02-11 LAB — BASIC METABOLIC PANEL
Anion gap: 5 (ref 5–15)
BUN: 20 mg/dL (ref 6–20)
CO2: 27 mmol/L (ref 22–32)
Calcium: 7.9 mg/dL — ABNORMAL LOW (ref 8.9–10.3)
Chloride: 99 mmol/L (ref 98–111)
Creatinine, Ser: 0.75 mg/dL (ref 0.44–1.00)
GFR, Estimated: >60 mL/min (ref >60)
Glucose, Bld: 256 mg/dL — ABNORMAL HIGH (ref 70–99)
Potassium: 3.9 mmol/L (ref 3.5–5.1)
Sodium: 131 mmol/L — ABNORMAL LOW (ref 135–145)

## 2022-02-11 LAB — GLUCOSE, CAPILLARY
Glucose-Capillary: 278 mg/dL — ABNORMAL HIGH (ref 70–99)
Glucose-Capillary: 362 mg/dL — ABNORMAL HIGH (ref 70–99)

## 2022-02-11 LAB — BODY FLUID CULTURE W GRAM STAIN

## 2022-02-11 MED ORDER — METFORMIN HCL 500 MG PO TABS: 1000.0000 mg | ORAL_TABLET | Freq: Two times a day (BID) | ORAL | Status: DC

## 2022-02-11 MED ORDER — AMOXICILLIN-POT CLAVULANATE 875-125 MG PO TABS: 1.0000 | ORAL_TABLET | Freq: Two times a day (BID) | ORAL | 0 refills | Status: AC

## 2022-02-11 MED ORDER — METFORMIN HCL 500 MG PO TABS: 1000.0000 mg | ORAL_TABLET | Freq: Two times a day (BID) | ORAL | 3 refills | Status: DC

## 2022-02-11 NOTE — Progress Notes (Signed)
? ?  NAME:  Ashley Mcgee, MRN:  [id number], DOB:  May 17, 1971, LOS: 4 ?ADMISSION DATE:  02/07/2022, CONSULTATION DATE:  02/07/22 ?REFERRING MD:  Karene Fry, CHIEF COMPLAINT:  I've fallen and I can't get up  ? ?History of Present Illness:  ?49yF with history of alcohol use, poorly controlled DM2 with DM neuropathy, HTN, HLD, smoking who was BIBEMS for fall today, she couldn't get up on her own afterward. She has abdominal pain, pleuritic and positional CP, dyspneic with exertion. In ED found to have mild DKA and hyperglycemia as well as loculated R effusion without rib fractures. WBCs of 20, Hb stable relative to prior. Vanc, ceftriaxone, azithromycin ordered. EDP discussed with CVTS on call who recommended involving pulmonary for pigtail and trial of lytics if needed.  ? ?Pertinent  Medical History  ?Etoh use  ?Poorly controlled DM ?HTN ?Smoking ? ?Significant Hospital Events: ?Including procedures, antibiotic start and stop dates in addition to other pertinent events   ?02/07/22 admitted for mild DKA, hyperglycemia and loculated R pleural effusion ?4/17 right pigtail chest tube inserted overnight with of amber colored fluid with significant sediment seen  ?4/18 900 additional mL of output from chest tube post first round of tPA/dornase.  First Maralyn Sago almost full at 1900 mL will exchange at bedside this morning. Second dose of lytics given  ?4/19 No issues overnight, additional pleural fluid removed post second dose of pleural lytics  ? ?Interim History / Subjective:  ?Feeling better. Minimal chest tube output overnight.  ? ?Objective   ?Blood pressure 134/70, pulse 97, temperature 99.1 ?F (37.3 ?C), temperature source Oral, resp. rate 18, height 5\' 9"  (1.753 m), weight 95.2 kg, SpO2 94 %. ?   ?   ? ?Intake/Output Summary (Last 24 hours) at 02/11/2022 0905 ?Last data filed at 02/11/2022 0520 ?Gross per 24 hour  ?Intake 560 ml  ?Output 920 ml  ?Net -360 ml  ? ?Filed Weights  ? 02/07/22 1355 02/08/22 2115  ?Weight:  108.9 kg 95.2 kg  ? ?Examination: ?Chronically ill appearing ?Poor dental hygiene ?Laying flat comfortably no distress ?No increased work of breathing, no wheezes or crackles ?Diminished breath sounds bilaterally ?Morbidly obese.  ? ?Resolved Hospital Problem list   ? ? ?Assessment & Plan:  ?Parapneumonic effusion ?Presumed CAP ? ?S/p 3 days of fibrinolytics through chest tube. Substantial improvement in radiographic appearance (CT scan reviewed.) doubt will have more benefit with additional lytics. Has defervesced and WBC has come down. Ok to d/c chest tube. Recommend total 5 days of abx for CAP - unasyn/augmentin or similar.  ?No objection to discharge from pulmonary perspective. Discussed with patient and primary team.  ? ?2116, MD ?Pulmonary and Critical Care Medicine ?[facility] ?02/11/2022 9:12 AM ?Pager: see AMION ? ?If no response to pager, please call critical care on call (see AMION) until 7pm ?After 7:00 pm call Elink   ? ? ? ?  ?

## 2022-02-11 NOTE — Progress Notes (Signed)
Inpatient Diabetes Program Recommendations ? ?AACE/ADA: New Consensus Statement on Inpatient Glycemic Control (2015) ? ?Target Ranges:  Prepandial:   less than 140 mg/dL ?     Peak postprandial:   less than 180 mg/dL (1-2 hours) ?     Critically ill patients:  140 - 180 mg/dL  ? ?Lab Results  ?Component Value Date  ? GLUCAP 278 (H) 02/11/2022  ? HGBA1C 11.2 (H) 02/07/2022  ? ? ?Review of Glycemic Control ? Latest Reference Range & Units 02/10/22 07:33 02/10/22 11:47 02/10/22 15:44 02/10/22 21:43 02/11/22 07:31  ?Glucose-Capillary 70 - 99 mg/dL 254 (H) 270 (H) 623 (H) 306 (H) 278 (H)  ?(H): Data is abnormally high ? ?Diabetes history: DM 2 ?Outpatient Diabetes medications:  ?Metformin 1000 mg bid (patient admits not taking consistently) ?Current orders for Inpatient glycemic control:  ?Novolog resistant tid with meals & hs ?Novolog 6 units tid  ?Semglee 22 units daily ? ?Inpatient Diabetes Program Recommendations:   ? ?Semglee 26 units QD ? ?Will continue to follow while inpatient. ? ?Thank you, ?Dulce Sellar, MSN, RN ?Diabetes Coordinator ?Inpatient Diabetes Program ?[phone] (team pager from 8a-5p) ? ? ? ?

## 2022-02-11 NOTE — Discharge Summary (Signed)
Physician Discharge Summary  ?Gearl Kimbrough JOA:[id number] DOB: [birth year] DOA: 02/07/2022 ? ?PCP: Cain Saupe, MD ? ?Admit date: 02/07/2022 ?Discharge date: 02/11/2022 ? ?Admitted From: Home ?Disposition:  Home ? ?Recommendations for Outpatient Follow-up:  ?Follow up with PCP in 1-2 weeks ?Please obtain BMP/CBC in one week ? ? ?Home Health:No ?Equipment/Devices:None ?Discharge Condition:Stable ?CODE STATUS:Full ?Diet recommendation: Heart Healthy / ? ?Brief/Interim Summary: ? 51 y.o. female past medical history of alcohol abuse, poorly controlled diabetes mellitus with diabetic neuropathy, essential hypertension tobacco abuse brought into the ED by EMS complaining of shortness of breath chest pain x-ray showed right to middle lobe opacification, CT scan of the abdomen pelvis did show a large multiloculated right pleural effusion concerning for exudate and possible empyema.  Pulmonary and critical care was consulted, chest tube was placed and they started him on fibrinoLytics ? ?Discharge Diagnoses:  ?Right-sided community-acquired pneumonia of unknown organism/empyema of the left pleural space: ?Started on cefepime and azithromycin de-escalated to IV Unasyn pulmonary critical care was consulted chest tube was placed she was started on fibrinolytics for 3 days she defervesced leukocytosis improved she was transitioned to Augmentin and she will continue for an additional day as an outpatient. ? ?Hyponatremia: ?Likely due to primary pulmonary problem this resolved. ? ?Diabetic peripheral neuropathy/diabetes mellitus type 2: ?With last A1c of 11.2 she was started on long-acting insulin plus sliding scale she will continue her regimen at home no changes made. ?Patient admits to not taking her medications at home she will continue her insulin and metformin. ? ?Sinus tachycardia: ?Slowly improving likely due to primary pulmonary problem there is resolved. ? ?Essential hypertension: ?No changes made to her medication she  will resume them as an outpatient. ?Discharge Instructions ? ?Discharge Instructions   ? ? Diet - low sodium heart healthy   Complete by: As directed ?  ? Increase activity slowly   Complete by: As directed ?  ? ?  ? ?Allergies as of 02/11/2022   ?No Known Allergies ?  ? ?  ?Medication List  ?  ? ?TAKE these medications   ? ?accu-chek soft touch lancets ?Use as instructed ?  ?amoxicillin-clavulanate 875-125 MG tablet ?Commonly known as: Augmentin ?Take 1 tablet by mouth every 12 (twelve) hours for 1 day. ?  ?atorvastatin 40 MG tablet ?Commonly known as: LIPITOR ?Take 40 mg by mouth daily. ?  ?gabapentin 300 MG capsule ?Commonly known as: NEURONTIN ?Take 1 capsule (300 mg total) by mouth 3 (three) times daily. For nerve pain ?What changed: how much to take ?  ?glipiZIDE 10 MG tablet ?Commonly known as: GLUCOTROL ?Take 10 mg by mouth 2 (two) times daily before a meal. ?  ?glucose blood test strip ?Commonly known as: Accu-Chek Aviva ?Use as instructed to check blood sugar 2-3 times per day ?  ?ibuprofen 200 MG tablet ?Commonly known as: ADVIL ?Take 200 mg by mouth every 6 (six) hours as needed for headache. ?  ?Lantus SoloStar 100 UNIT/ML Solostar Pen ?Generic drug: insulin glargine ?Inject 12 Units into the skin daily. ?  ?lisinopril 2.5 MG tablet ?Commonly known as: ZESTRIL ?Take 1 tablet (2.5 mg total) by mouth daily. ?  ?metFORMIN 500 MG tablet ?Commonly known as: GLUCOPHAGE ?Take 1 tablet (500 mg total) by mouth 2 (two) times daily with a meal. ?What changed: how much to take ?  ?TRUEplus Pen Needles 32G X 4 MM Misc ?Generic drug: Insulin Pen Needle ?Use to inject insulin daily. ?  ? ?  ? ? ?No Known Allergies ? ?  Consultations: ?Pulmonary ? ? ?Procedures/Studies: ?DG Chest 2 View ? ?Result Date: 02/07/2022 ?CLINICAL DATA:  Chest pain EXAM: CHEST - 2 VIEW COMPARISON:  None. FINDINGS: The cardiomediastinal silhouette is within normal limits. There is opacification of the right mid to lower hemithorax. No pneumothorax.  No visible acute osseous abnormality radiographically. IMPRESSION: Opacification of the right mid to lower hemithorax, could be a large effusion with adjacent atelectasis. Underlying infection or mass is not excluded. Chest CT is pending. Electronically Signed   By: Caprice Renshaw M.D.   On: 02/07/2022 15:27  ? ?CT CHEST WO CONTRAST ? ?Result Date: 02/10/2022 ?CLINICAL DATA:  Empyema EXAM: CT CHEST WITHOUT CONTRAST TECHNIQUE: Multidetector CT imaging of the chest was performed following the standard protocol without IV contrast. RADIATION DOSE REDUCTION: This exam was performed according to the departmental dose-optimization program which includes automated exposure control, adjustment of the mA and/or kV according to patient size and/or use of iterative reconstruction technique. COMPARISON:  02/07/2022 FINDINGS: Cardiovascular: Extensive multi-vessel coronary artery calcification. Global cardiac size within normal limits. Trace pericardial effusion. Central pulmonary arteries are of normal caliber. Mild atherosclerotic calcification within the thoracic aorta. No aortic aneurysm. Mediastinum/Nodes: Shotty right paratracheal, right hilar, and subcarinal adenopathy is likely reactive in nature. No frankly pathologic thoracic adenopathy. Esophagus is unremarkable. Thyroid unremarkable. Lungs/Pleura: Since the prior examination, a pigtail drainage catheter has been placed within the a posterolateral aspect of the right pleural space with its pigtail loop formed just within the thoracic cage, partially unlooped. There has been interval evacuation of the majority of right pleural fluid with residual gas and pleural thickening noted. There are multiple loculated residual components, however. Components are seen within the anterior pleural space and sub pulmonic region. Dominant residual components are seen within the major fissure measuring 9.3 x 9.8 x 3.7 cm and within the medial costophrenic sulcus measuring roughly 3.1 x  5.8 x 7.2 cm. Scattered ground-glass infiltrate is seen within the a posterior segment of the right upper lobe, likely infectious in the acute setting. Minimal ground-glass infiltrate is seen within the anterior left upper lobe. Nodular consolidation within the left lower lobe is unchanged. No pneumothorax or pleural effusion on the left. Upper Abdomen: No acute abnormality. Musculoskeletal: No acute bone abnormality. IMPRESSION: Interval right pigtail chest tube placement with partial evacuation of the right pleural effusion. Loculated components persist anteriorly, within the sub pulmonic region, within the major fissure, and within the posteromedial costophrenic sulcus. Focal infiltrate within the posterior segment of the right upper lobe, likely infectious in the acute setting. Minimal pneumonitis within the left upper lobe also noted. Nodular consolidation within the left lower lobe persists, not well characterized. Short-term follow-up evaluation once the patient's acute issues have resolved to document resolution. Extensive multi-vessel coronary artery calcification Aortic Atherosclerosis (ICD10-I70.0). Electronically Signed   By: Helyn Numbers M.D.   On: 02/10/2022 19:44  ? ?CT Chest W Contrast ? ?Result Date: 02/07/2022 ?CLINICAL DATA:  Abdominal pain, acute, nonlocalized RUQ pain; Abnormal xray - lung opacity/opacities EXAM: CT CHEST AND ABDOMEN WITH CONTRAST TECHNIQUE: Multidetector CT imaging of the chest and abdomen was performed following the standard protocol during bolus administration of intravenous contrast. RADIATION DOSE REDUCTION: This exam was performed according to the departmental dose-optimization program which includes automated exposure control, adjustment of the mA and/or kV according to patient size and/or use of iterative reconstruction technique. CONTRAST:  48mL OMNIPAQUE IOHEXOL 300 MG/ML  SOLN COMPARISON:  Same day chest radiograph FINDINGS: CT CHEST FINDINGS Cardiovascular: Normal  cardiac size.No pericardial disease.Coronary artery calcifications. Normal size main and branch pulmonary arteries.Mild atherosclerosis of the thoracic aorta. Mediastinum/Nodes: Prominent subcarinal lymph

## 2022-02-11 NOTE — Plan of Care (Signed)
Discharge instructions reviewed with patient, questions answered, verbalized understanding.  Patient transported to main entrance via wheelchair to be taken home by friend.  Patient had cell phone and glasses in her possession.   ?

## 2022-02-12 ENCOUNTER — Telehealth: Payer: Self-pay

## 2022-02-12 NOTE — Telephone Encounter (Signed)
Transition Care Management Unsuccessful Follow-up Telephone Call ? ?Date of discharge and from where:  [facility] on 02/11/2022 ? ?Attempts:  1st Attempt ? ?Reason for unsuccessful TCM follow-up call:  Left voice message. Call back requested.  ? ?  ?

## 2022-02-15 ENCOUNTER — Telehealth: Payer: Self-pay

## 2022-02-15 NOTE — Telephone Encounter (Signed)
Transition Care Management Unsuccessful Follow-up Telephone Call ? ?Date of discharge and from where:  02/11/2022, [facility]  ? ?Attempts:  2nd Attempt ? ?Reason for unsuccessful TCM follow-up call:  Left voice message on # [phone]. Call back requested.  ? ?Need to discuss scheduling a hospital follow up appointment  ? ? ? ?

## 2022-02-16 ENCOUNTER — Telehealth: Payer: Self-pay

## 2022-02-16 NOTE — Telephone Encounter (Signed)
Transition Care Management Unsuccessful Follow-up Telephone Call ? ?Date of discharge and from where:  02/11/2022, [facility] ? ?Attempts:  3rd Attempt ? ?Reason for unsuccessful TCM follow-up call:  Left voice message # [phone]. Call back requested. ? ?Letter also sent to patient instructing her to call [facility] to schedule a follow up appointment as we have not been able to reach her.  ? ? ? ?

## 2022-02-23 LAB — MISC LABCORP TEST (SEND OUT): Labcorp test code: 9985

## 2022-03-11 LAB — FUNGUS CULTURE WITH STAIN

## 2022-03-11 LAB — FUNGUS CULTURE RESULT

## 2022-03-11 LAB — FUNGAL ORGANISM REFLEX

## 2022-04-30 ENCOUNTER — Telehealth: Payer: Self-pay | Admitting: Family Medicine

## 2022-04-30 NOTE — Telephone Encounter (Signed)
Reason for CRM: The patient would like to speak with a member of clinical staff when possible        The patient would like to review their current prescriptions         Please contact further

## 2022-05-12 NOTE — Telephone Encounter (Signed)
Called pt and left vm about matter

## 2022-05-26 ENCOUNTER — Ambulatory Visit: Payer: Medicaid Other | Admitting: Physician Assistant

## 2022-06-16 ENCOUNTER — Ambulatory Visit: Payer: Medicaid Other | Admitting: Physician Assistant

## 2022-07-29 ENCOUNTER — Ambulatory Visit (INDEPENDENT_AMBULATORY_CARE_PROVIDER_SITE_OTHER): Payer: Medicaid Other | Admitting: Primary Care

## 2022-08-12 ENCOUNTER — Other Ambulatory Visit: Payer: Self-pay

## 2022-08-12 ENCOUNTER — Encounter (INDEPENDENT_AMBULATORY_CARE_PROVIDER_SITE_OTHER): Payer: Self-pay | Admitting: Primary Care

## 2022-08-12 ENCOUNTER — Ambulatory Visit (INDEPENDENT_AMBULATORY_CARE_PROVIDER_SITE_OTHER): Payer: Medicaid Other | Admitting: Primary Care

## 2022-08-12 ENCOUNTER — Other Ambulatory Visit: Payer: Self-pay | Admitting: Pharmacist

## 2022-08-12 VITALS — BP 138/81 | HR 80 | Resp 16 | Ht 70.0 in | Wt 192.4 lb

## 2022-08-12 DIAGNOSIS — Z1159 Encounter for screening for other viral diseases: Secondary | ICD-10-CM

## 2022-08-12 DIAGNOSIS — E1165 Type 2 diabetes mellitus with hyperglycemia: Secondary | ICD-10-CM

## 2022-08-12 DIAGNOSIS — Z1231 Encounter for screening mammogram for malignant neoplasm of breast: Secondary | ICD-10-CM

## 2022-08-12 DIAGNOSIS — I10 Essential (primary) hypertension: Secondary | ICD-10-CM

## 2022-08-12 DIAGNOSIS — E1142 Type 2 diabetes mellitus with diabetic polyneuropathy: Secondary | ICD-10-CM

## 2022-08-12 DIAGNOSIS — G459 Transient cerebral ischemic attack, unspecified: Secondary | ICD-10-CM | POA: Diagnosis not present

## 2022-08-12 DIAGNOSIS — Z1211 Encounter for screening for malignant neoplasm of colon: Secondary | ICD-10-CM

## 2022-08-12 DIAGNOSIS — E118 Type 2 diabetes mellitus with unspecified complications: Secondary | ICD-10-CM

## 2022-08-12 LAB — POCT GLYCOSYLATED HEMOGLOBIN (HGB A1C): HbA1c, POC (controlled diabetic range): 10.4 % — AB (ref 0.0–7.0)

## 2022-08-12 MED ORDER — METFORMIN HCL 500 MG PO TABS
500.0000 mg | ORAL_TABLET | Freq: Two times a day (BID) | ORAL | 3 refills | Status: AC
  Filled 2022-08-12 – 2022-08-13 (×2): qty 180, 90d supply, fill #0

## 2022-08-12 MED ORDER — OZEMPIC (0.25 OR 0.5 MG/DOSE) 2 MG/3ML ~~LOC~~ SOPN
PEN_INJECTOR | SUBCUTANEOUS | 2 refills | Status: AC
  Filled 2022-08-12: qty 3, fill #0
  Filled 2022-08-13: qty 3, 42d supply, fill #0

## 2022-08-12 MED ORDER — LANTUS SOLOSTAR 100 UNIT/ML ~~LOC~~ SOPN
14.0000 [IU] | PEN_INJECTOR | Freq: Every day | SUBCUTANEOUS | 4 refills | Status: AC
  Filled 2022-08-12: qty 15, 107d supply, fill #0

## 2022-08-12 NOTE — Progress Notes (Signed)
A1c-10.4

## 2022-08-12 NOTE — Patient Instructions (Signed)
Semaglutide Injection What is this medication? SEMAGLUTIDE (SEM a GLOO tide) treats type 2 diabetes. It works by increasing insulin levels in your body, which decreases your blood sugar (glucose). It also reduces the amount of sugar released into the blood and slows down your digestion. It can also be used to lower the risk of heart attack and stroke in people with type 2 diabetes. Changes to diet and exercise are often combined with this medication. This medicine may be used for other purposes; ask your health care provider or pharmacist if you have questions. COMMON BRAND NAME(S): OZEMPIC What should I tell my care team before I take this medication? They need to know if you have any of these conditions: Endocrine tumors (MEN 2) or if someone in your family had these tumors Eye disease, vision problems History of pancreatitis Kidney disease Stomach problems Thyroid cancer or if someone in your family had thyroid cancer An unusual or allergic reaction to semaglutide, other medications, foods, dyes, or preservatives Pregnant or trying to get pregnant Breast-feeding How should I use this medication? This medication is for injection under the skin of your upper leg (thigh), stomach area, or upper arm. It is given once every week (every 7 days). You will be taught how to prepare and give this medication. Use exactly as directed. Take your medication at regular intervals. Do not take it more often than directed. If you use this medication with insulin, you should inject this medication and the insulin separately. Do not mix them together. Do not give the injections right next to each other. Change (rotate) injection sites with each injection. It is important that you put your used needles and syringes in a special sharps container. Do not put them in a trash can. If you do not have a sharps container, call your pharmacist or care team to get one. A special MedGuide will be given to you by the  pharmacist with each prescription and refill. Be sure to read this information carefully each time. This medication comes with INSTRUCTIONS FOR USE. Ask your pharmacist for directions on how to use this medication. Read the information carefully. Talk to your pharmacist or care team if you have questions. Talk to your care team about the use of this medication in children. Special care may be needed. Overdosage: If you think you have taken too much of this medicine contact a poison control center or emergency room at once. NOTE: This medicine is only for you. Do not share this medicine with others. What if I miss a dose? If you miss a dose, take it as soon as you can within 5 days after the missed dose. Then take your next dose at your regular weekly time. If it has been longer than 5 days after the missed dose, do not take the missed dose. Take the next dose at your regular time. Do not take double or extra doses. If you have questions about a missed dose, contact your care team for advice. What may interact with this medication? Other medications for diabetes Many medications may cause changes in blood sugar, these include: Alcohol containing beverages Antiviral medications for HIV or AIDS Aspirin and aspirin-like medications Certain medications for blood pressure, heart disease, irregular heart beat Chromium Diuretics Female hormones, such as estrogens or progestins, birth control pills Fenofibrate Gemfibrozil Isoniazid Lanreotide Female hormones or anabolic steroids MAOIs like Carbex, Eldepryl, Marplan, Nardil, and Parnate Medications for weight loss Medications for allergies, asthma, cold, or cough Medications for depression,   anxiety, or psychotic disturbances Niacin Nicotine NSAIDs, medications for pain and inflammation, like ibuprofen or naproxen Octreotide Pasireotide Pentamidine Phenytoin Probenecid Quinolone antibiotics such as ciprofloxacin, levofloxacin, ofloxacin Some  herbal dietary supplements Steroid medications such as prednisone or cortisone Sulfamethoxazole; trimethoprim Thyroid hormones Some medications can hide the warning symptoms of low blood sugar (hypoglycemia). You may need to monitor your blood sugar more closely if you are taking one of these medications. These include: Beta-blockers, often used for high blood pressure or heart problems (examples include atenolol, metoprolol, propranolol) Clonidine Guanethidine Reserpine This list may not describe all possible interactions. Give your health care provider a list of all the medicines, herbs, non-prescription drugs, or dietary supplements you use. Also tell them if you smoke, drink alcohol, or use illegal drugs. Some items may interact with your medicine. What should I watch for while using this medication? Visit your care team for regular checks on your progress. Drink plenty of fluids while taking this medication. Check with your care team if you get an attack of severe diarrhea, nausea, and vomiting. The loss of too much body fluid can make it dangerous for you to take this medication. A test called the HbA1C (A1C) will be monitored. This is a simple blood test. It measures your blood sugar control over the last 2 to 3 months. You will receive this test every 3 to 6 months. Learn how to check your blood sugar. Learn the symptoms of low and high blood sugar and how to manage them. Always carry a quick-source of sugar with you in case you have symptoms of low blood sugar. Examples include hard sugar candy or glucose tablets. Make sure others know that you can choke if you eat or drink when you develop serious symptoms of low blood sugar, such as seizures or unconsciousness. They must get medical help at once. Tell your care team if you have high blood sugar. You might need to change the dose of your medication. If you are sick or exercising more than usual, you might need to change the dose of your  medication. Do not skip meals. Ask your care team if you should avoid alcohol. Many nonprescription cough and cold products contain sugar or alcohol. These can affect blood sugar. Pens should never be shared. Even if the needle is changed, sharing may result in passing of viruses like hepatitis or HIV. Wear a medical ID bracelet or chain, and carry a card that describes your disease and details of your medication and dosage times. Do not become pregnant while taking this medication. Women should inform their care team if they wish to become pregnant or think they might be pregnant. There is a potential for serious side effects to an unborn child. Talk to your care team for more information. What side effects may I notice from receiving this medication? Side effects that you should report to your care team as soon as possible: Allergic reactions--skin rash, itching, hives, swelling of the face, lips, tongue, or throat Change in vision Dehydration--increased thirst, dry mouth, feeling faint or lightheaded, headache, dark yellow or brown urine Gallbladder problems--severe stomach pain, nausea, vomiting, fever Heart palpitations--rapid, pounding, or irregular heartbeat Kidney injury--decrease in the amount of urine, swelling of the ankles, hands, or feet Pancreatitis--severe stomach pain that spreads to your back or gets worse after eating or when touched, fever, nausea, vomiting Thyroid cancer--new mass or lump in the neck, pain or trouble swallowing, trouble breathing, hoarseness Side effects that usually do not require medical   attention (report to your care team if they continue or are bothersome): Diarrhea Loss of appetite Nausea Stomach pain Vomiting This list may not describe all possible side effects. Call your doctor for medical advice about side effects. You may report side effects to FDA at 1-800-FDA-1088. Where should I keep my medication? Keep out of the reach of children. Store  unopened pens in a refrigerator between 2 and 8 degrees C (36 and 46 degrees F). Do not freeze. Protect from light and heat. After you first use the pen, it can be stored for 56 days at room temperature between 15 and 30 degrees C (59 and 86 degrees F) or in a refrigerator. Throw away your used pen after 56 days or after the expiration date, whichever comes first. Do not store your pen with the needle attached. If the needle is left on, medication may leak from the pen. NOTE: This sheet is a summary. It may not cover all possible information. If you have questions about this medicine, talk to your doctor, pharmacist, or health care provider.  2023 Elsevier/Gold Standard (2020-12-25 00:00:00)  

## 2022-08-12 NOTE — Progress Notes (Signed)
[facility]   Subjective:   Ashley Mcgee is a 51 y.o. female presents for establish care. Past Medical History:  Diagnosis Date   Alcohol dependence in remission [facility])    Diabetic peripheral neuropathy (HCC)    Hyperlipidemia    Tobacco dependence    Type 2 diabetes with complication (HCC)      No Known Allergies    Current Outpatient Medications on File Prior to Visit  Medication Sig Dispense Refill   gabapentin (NEURONTIN) 300 MG capsule Take 1 capsule (300 mg total) by mouth 3 (three) times daily. For nerve pain (Patient taking differently: Take 600 mg by mouth 3 (three) times daily. For nerve pain) 90 capsule 0   atorvastatin (LIPITOR) 40 MG tablet Take 40 mg by mouth daily. (Patient not taking: Reported on 02/07/2022)     glipiZIDE (GLUCOTROL) 10 MG tablet Take 10 mg by mouth 2 (two) times daily before a meal. (Patient not taking: Reported on 02/07/2022)     lisinopril (ZESTRIL) 2.5 MG tablet Take 1 tablet (2.5 mg total) by mouth daily. (Patient not taking: Reported on 02/07/2022) 90 tablet 1   No current facility-administered medications on file prior to visit.     Review of System: Comprehensive ROS Pertinent positive and negative noted in HPI    Objective:  BP 138/81   Pulse 80   Resp 16   Ht '5\' 10"'  (1.778 m)   Wt 192 lb 6.4 oz (87.3 kg)   SpO2 99%   BMI 27.61 kg/m   Filed Weights   08/12/22 1532  Weight: 192 lb 6.4 oz (87.3 kg)    Physical Exam: General Appearance: Well nourished, in no apparent distress. Eyes: PERRLA, EOMs, conjunctiva no swelling or erythema Sinuses: No Frontal/maxillary tenderness ENT/Mouth: Ext aud canals clear, TMs without erythema, bulging. No erythema, swelling, or exudate on post pharynx.  Tonsils not swollen or erythematous. Hearing normal.  Neck: Supple, thyroid normal.  Respiratory: Respiratory effort normal, BS equal bilaterally without rales, rhonchi, wheezing or stridor.  Cardio: RRR with no MRGs. Brisk  peripheral pulses without edema.  Abdomen: Soft, + BS.  Non tender, no guarding, rebound, hernias, masses. Lymphatics: Non tender without lymphadenopathy.  Musculoskeletal: Full ROM, 5/5 strength, normal gait.  Skin: Warm, dry without rashes, lesions, ecchymosis.  Neuro: Cranial nerves intact. Normal muscle tone, no cerebellar symptoms. Sensation intact.  Psych: Awake and oriented X 3, normal affect, Insight and Judgment appropriate.    Assessment:   Ashley Mcgee was seen today for new patient (initial visit).  Diagnoses and all orders for this visit:  Screen for colon cancer -     Ambulatory referral to Gastroenterology  Essential hypertension BP goal - < 130/80 Explained that having normal blood pressure is the goal and medications are helping to get to goal and maintain normal blood pressure. DIET: Limit salt intake, read nutrition labels to check salt content, limit fried and high fatty foods  Avoid using multisymptom OTC cold preparations that generally contain sudafed which can rise BP. Consult with pharmacist on best cold relief products to use for persons with HTN EXERCISE Discussed incorporating exercise such as walking - 30 minutes most days of the week and can do in 10 minute intervals    -     CMP14+EGFR   Type 2 diabetes with non complication ([place]) 2/2 obesity - educated on lifestyle modifications, including but not limited to diet choices and adding exercise to daily routine.   Started on Ozempic taught patient how to  use pen with weekly instructions also printed on AVS to follow-up with clinical pharmacist in 4 to 6 weeks.  Patient return demonstration of the use of the pen. Note patient cannot take at 1000 mg of metformin at 1 time she can tolerate only 501 end of time -     POCT glycosylated hemoglobin (Hb A1C) -     Microalbumin / creatinine urine ratio -     CBC -     Lipid panel  Encounter for HCV screening test for low risk patient -     HCV Ab w Reflex to Quant  PCR  Encounter for screening mammogram for malignant neoplasm of breast -     MM DIGITAL SCREENING BILATERAL; Future  TIA (transient ischemic attack) -     Ambulatory referral to Neurology  Other orders -     metFORMIN (GLUCOPHAGE) 500 MG tablet; Take 1 tablet (500 mg total) by mouth 2 (two) times daily with a meal.      Meds ordered this encounter  Medications   metFORMIN (GLUCOPHAGE) 500 MG tablet    Sig: Take 1 tablet (500 mg total) by mouth 2 (two) times daily with a meal.    Dispense:  180 tablet    Refill:  3    Order Specific Question:   Supervising Provider    Answer:   Tresa Garter [[id number]]   insulin glargine (LANTUS SOLOSTAR) 100 UNIT/ML Solostar Pen    Sig: Inject 14 Units into the skin at bedtime.    Dispense:  15 mL    Refill:  4    Also needs pen needles; does not need refill today but dose changed for next refill    Order Specific Question:   Supervising Provider    Answer:   Tresa Garter [[id number]]    This note has been created with Dragon speech recognition software and smart phrase technology. Any transcriptional errors are unintentional.   Kerin Perna, NP 08/12/2022, 4:00 PM

## 2022-08-13 ENCOUNTER — Other Ambulatory Visit: Payer: Self-pay

## 2022-08-16 ENCOUNTER — Other Ambulatory Visit (INDEPENDENT_AMBULATORY_CARE_PROVIDER_SITE_OTHER): Payer: Medicaid Other

## 2022-08-16 DIAGNOSIS — E118 Type 2 diabetes mellitus with unspecified complications: Secondary | ICD-10-CM | POA: Diagnosis not present

## 2022-08-17 ENCOUNTER — Other Ambulatory Visit: Payer: Self-pay

## 2022-08-17 ENCOUNTER — Other Ambulatory Visit (INDEPENDENT_AMBULATORY_CARE_PROVIDER_SITE_OTHER): Payer: Self-pay | Admitting: Primary Care

## 2022-08-17 LAB — LIPID PANEL
Chol/HDL Ratio: 6.1 ratio — ABNORMAL HIGH (ref 0.0–4.4)
Cholesterol, Total: 262 mg/dL — ABNORMAL HIGH (ref 100–199)
HDL: 43 mg/dL (ref >39)
LDL Chol Calc (NIH): 177 mg/dL — ABNORMAL HIGH (ref 0–99)
Triglycerides: 221 mg/dL — ABNORMAL HIGH (ref 0–149)
VLDL Cholesterol Cal: 42 mg/dL — ABNORMAL HIGH (ref 5–40)

## 2022-08-17 LAB — CMP14+EGFR
ALT: 13 IU/L (ref 0–32)
AST: 13 IU/L (ref 0–40)
Albumin/Globulin Ratio: 2 (ref 1.2–2.2)
Albumin: 4.9 g/dL (ref 3.8–4.9)
Alkaline Phosphatase: 97 IU/L (ref 44–121)
BUN/Creatinine Ratio: 15 (ref 9–23)
BUN: 10 mg/dL (ref 6–24)
Bilirubin Total: 0.2 mg/dL (ref 0.0–1.2)
CO2: 21 mmol/L (ref 20–29)
Calcium: 9.6 mg/dL (ref 8.7–10.2)
Chloride: 91 mmol/L — ABNORMAL LOW (ref 96–106)
Creatinine, Ser: 0.66 mg/dL (ref 0.57–1.00)
Globulin, Total: 2.4 g/dL (ref 1.5–4.5)
Glucose: 184 mg/dL — ABNORMAL HIGH (ref 70–99)
Potassium: 4.4 mmol/L (ref 3.5–5.2)
Sodium: 131 mmol/L — ABNORMAL LOW (ref 134–144)
Total Protein: 7.3 g/dL (ref 6.0–8.5)
eGFR: 105 mL/min/{1.73_m2} (ref >59)

## 2022-08-17 LAB — CBC
Hematocrit: 41.3 % (ref 34.0–46.6)
Hemoglobin: 14.3 g/dL (ref 11.1–15.9)
MCH: 32.6 pg (ref 26.6–33.0)
MCHC: 34.6 g/dL (ref 31.5–35.7)
MCV: 94 fL (ref 79–97)
Platelets: 265 10*3/uL (ref 150–450)
RBC: 4.38 x10E6/uL (ref 3.77–5.28)
RDW: 12.6 % (ref 11.7–15.4)
WBC: 8.5 10*3/uL (ref 3.4–10.8)

## 2022-08-17 LAB — HCV AB W REFLEX TO QUANT PCR: HCV Ab: NONREACTIVE

## 2022-08-17 LAB — HCV INTERPRETATION

## 2022-08-17 MED ORDER — ROSUVASTATIN CALCIUM 40 MG PO TABS
40.0000 mg | ORAL_TABLET | Freq: Every day | ORAL | 3 refills | Status: AC
  Filled 2022-08-17: qty 90, 90d supply, fill #0

## 2022-08-24 ENCOUNTER — Emergency Department (HOSPITAL_COMMUNITY): Payer: Medicaid Other

## 2022-08-24 ENCOUNTER — Emergency Department (HOSPITAL_COMMUNITY)
Admission: EM | Admit: 2022-08-24 | Discharge: 2022-08-24 | Disposition: A | Discharge status: Home / Self Care | Payer: Medicaid Other | Attending: Emergency Medicine | Admitting: Emergency Medicine

## 2022-08-24 ENCOUNTER — Other Ambulatory Visit: Payer: Self-pay

## 2022-08-24 ENCOUNTER — Encounter (HOSPITAL_COMMUNITY): Payer: Self-pay

## 2022-08-24 DIAGNOSIS — W010XXA Fall on same level from slipping, tripping and stumbling without subsequent striking against object, initial encounter: Secondary | ICD-10-CM | POA: Diagnosis not present

## 2022-08-24 DIAGNOSIS — W19XXXA Unspecified fall, initial encounter: Secondary | ICD-10-CM

## 2022-08-24 DIAGNOSIS — S42202A Unspecified fracture of upper end of left humerus, initial encounter for closed fracture: Secondary | ICD-10-CM | POA: Diagnosis not present

## 2022-08-24 DIAGNOSIS — S4992XA Unspecified injury of left shoulder and upper arm, initial encounter: Secondary | ICD-10-CM | POA: Diagnosis present

## 2022-08-24 MED ORDER — OXYCODONE-ACETAMINOPHEN 5-325 MG PO TABS
1.0000 | ORAL_TABLET | Freq: Four times a day (QID) | ORAL | 0 refills | Status: DC | PRN
  Filled 2022-08-24: qty 15, 4d supply, fill #0

## 2022-08-24 MED ORDER — OXYCODONE-ACETAMINOPHEN 5-325 MG PO TABS: 1.0000 | ORAL_TABLET | Freq: Four times a day (QID) | ORAL | 0 refills | Status: AC | PRN

## 2022-08-24 MED ORDER — OXYCODONE-ACETAMINOPHEN 5-325 MG PO TABS
1.0000 | ORAL_TABLET | Freq: Once | ORAL | Status: AC
  Administered 2022-08-24: 1 via ORAL
  Filled 2022-08-24: qty 1

## 2022-08-24 MED ORDER — IBUPROFEN 800 MG PO TABS
800.0000 mg | ORAL_TABLET | Freq: Once | ORAL | Status: AC
  Administered 2022-08-24: 800 mg via ORAL
  Filled 2022-08-24: qty 1

## 2022-08-24 NOTE — ED Triage Notes (Signed)
Patient BIB [facility] from home. Fell at 5am. Her left shoulder popped out of place. She put it back in. Called EMS 1 hour ago. Now swelling to area. Able to feel her fingers, but painful to move. No LOC.

## 2022-08-24 NOTE — Discharge Instructions (Addendum)
You are seen in the emergency department for evaluation of left shoulder injury after a fall.  You had a fracture of your left upper arm.  This was treated with a sling.  We are prescribing you a short course of some pain medication.  Please use caution as this may make you nauseous dizzy and constipated.  Please contact Dr. Stann Mainland office for close follow-up.  Return to the emergency department if any worsening or concerning symptoms.

## 2022-08-24 NOTE — ED Provider Notes (Signed)
[facility] DEPT Provider Note   CSN: [id number] Arrival date & time: 08/24/22  1708     History {Add pertinent medical, surgical, social history, OB history to HPI:1} Chief Complaint  Patient presents with   Shoulder Injury    Ashley Mcgee is a 51 y.o. female.  She is complaining of a trip and fall early this morning injuring her left shoulder.  She denies any other injuries.  Complaining of significant left shoulder pain.  Rates it as 12 out of 10.  No numbness or weakness.  No loss of consciousness.  No head or neck pain.  She normally walks without a walker but sometimes uses it for stability.  The history is provided by the patient.  Shoulder Injury This is a new problem. The current episode started 12 to 24 hours ago. The problem occurs constantly. The problem has not changed since onset.Pertinent negatives include no chest pain, no abdominal pain, no headaches and no shortness of breath. The symptoms are aggravated by bending and twisting. Nothing relieves the symptoms. She has tried rest for the symptoms. The treatment provided no relief.       Home Medications Prior to Admission medications   Medication Sig Start Date End Date Taking? Authorizing Provider  insulin glargine (LANTUS SOLOSTAR) 100 UNIT/ML Solostar Pen Inject 14 Units into the skin at bedtime. 08/12/22   Kerin Perna, NP  metFORMIN (GLUCOPHAGE) 500 MG tablet Take 1 tablet (500 mg total) by mouth 2 (two) times daily with a meal. 08/12/22   Kerin Perna, NP  rosuvastatin (CRESTOR) 40 MG tablet Take 1 tablet (40 mg total) by mouth daily. 08/17/22   Kerin Perna, NP  Semaglutide,0.25 or 0.5MG /DOS, (OZEMPIC, 0.25 OR 0.5 MG/DOSE,) 2 MG/3ML SOPN Inject 0.25 mg under the skin once weekly for 4 weeks. Then, increase to 0.5 mg under the skin once weekly thereafter. 08/12/22   Charlott Rakes, MD      Allergies    Patient has no known allergies.    Review of Systems    Review of Systems  Constitutional:  Negative for fever.  HENT:  Negative for sore throat.   Eyes:  Negative for visual disturbance.  Respiratory:  Negative for shortness of breath.   Cardiovascular:  Negative for chest pain.  Gastrointestinal:  Negative for abdominal pain.  Genitourinary:  Negative for dysuria.  Musculoskeletal:  Negative for neck pain.  Skin:  Negative for rash.  Neurological:  Negative for headaches.    Physical Exam Updated Vital Signs BP (!) 142/73   Pulse (!) 115   Temp 98 F (36.7 C) (Oral)   Resp (!) 24   SpO2 100%  Physical Exam Vitals and nursing note reviewed.  Constitutional:      General: She is not in acute distress.    Appearance: Normal appearance. She is well-developed.  HENT:     Head: Normocephalic and atraumatic.  Eyes:     Conjunctiva/sclera: Conjunctivae normal.  Cardiovascular:     Rate and Rhythm: Normal rate and regular rhythm.     Heart sounds: No murmur heard. Pulmonary:     Effort: Pulmonary effort is normal. No respiratory distress.     Breath sounds: Normal breath sounds.  Abdominal:     Palpations: Abdomen is soft.     Tenderness: There is no abdominal tenderness.  Musculoskeletal:        General: Tenderness present.     Cervical back: Neck supple.     Comments: She  is diffusely tender about her left shoulder.  Her elbow and wrist are nontender and distal neurovascular intact.  Skin:    General: Skin is warm and dry.     Capillary Refill: Capillary refill takes less than 2 seconds.  Neurological:     General: No focal deficit present.     Mental Status: She is alert.     Sensory: No sensory deficit.     Motor: No weakness.     ED Results / Procedures / Treatments   Labs (all labs ordered are listed, but only abnormal results are displayed) Labs Reviewed - No data to display  EKG None  Radiology DG Shoulder Left  Result Date: 08/24/2022 CLINICAL DATA:  Fall EXAM: LEFT SHOULDER - 2+ VIEW COMPARISON:   None Available. FINDINGS: There is a comminuted left humeral neck fracture. There is 1/2 shaft with medial and anterior displacement of the distal fracture fragment. There are findings suspicious for inferior subluxation/dislocation which may be related to hemarthrosis. There is soft tissue swelling surrounding the fracture. IMPRESSION: 1.  Comminuted mildly displaced left humeral neck fracture. 2. Inferior subluxation/dislocation which may be related to hemarthrosis. Electronically Signed   By: Darliss Cheney M.D.   On: 08/24/2022 18:28    Procedures Procedures  {Document cardiac monitor, telemetry assessment procedure when appropriate:1}  Medications Ordered in ED Medications  ibuprofen (ADVIL) tablet 800 mg (800 mg Oral Given 08/24/22 2002)    ED Course/ Medical Decision Making/ A&P Clinical Course as of 08/24/22 2029  Tue Aug 24, 2022  1956 X-ray of left shoulder shows a fracture of the neck of the humerus.  It is also inferiorly displaced question subluxation.  Awaiting radiology input. [MB]    Clinical Course User Index [MB] Terrilee Files, MD                           Medical Decision Making  ***  {Document critical care time when appropriate:1} {Document review of labs and clinical decision tools ie heart score, Chads2Vasc2 etc:1}  {Document your independent review of radiology images, and any outside records:1} {Document your discussion with family members, caretakers, and with consultants:1} {Document social determinants of health affecting pt's care:1} {Document your decision making why or why not admission, treatments were needed:1} Final Clinical Impression(s) / ED Diagnoses Final diagnoses:  None    Rx / DC Orders ED Discharge Orders     None

## 2022-08-24 NOTE — ED Provider Triage Note (Signed)
Emergency Medicine Provider Triage Evaluation Note  Ashley Mcgee , a 51 y.o. female  was evaluated in triage.  Pt complains of left shoulder pain as the result of a mechanical fall around 5 AM today. Review of Systems  Positive: Left shoulder pain. Peripheral neuropathy. Negative: LOC. Elbow, forearm, hand, humerus pain  Physical Exam  BP (!) 146/84   Pulse 100   Temp 98 F (36.7 C) (Oral)   Resp 17   SpO2 99%  Gen:   Awake, no distress   Resp:  Normal effort  MSK:   Left arm splinted. Distal pulses, motor and sensation intact Other:    Medical Decision Making  Medically screening exam initiated at 6:00 PM.  Appropriate orders placed.  Tate Jerkins was informed that the remainder of the evaluation will be completed by another provider, this initial triage assessment does not replace that evaluation, and the importance of remaining in the ED until their evaluation is complete.     Etta Quill, NP 08/24/22 1810

## 2022-08-25 ENCOUNTER — Other Ambulatory Visit: Payer: Self-pay

## 2022-09-03 ENCOUNTER — Telehealth (INDEPENDENT_AMBULATORY_CARE_PROVIDER_SITE_OTHER): Payer: Self-pay | Admitting: Primary Care

## 2022-09-03 NOTE — Telephone Encounter (Signed)
Pt called explaining that she needs Home Health Services. Says she needs personal care assistance because she does not walk well and can only use one arm.   Best contact: [phone]

## 2022-09-06 NOTE — Telephone Encounter (Signed)
I can complete the form and send back to you to sign.

## 2022-09-06 NOTE — Telephone Encounter (Signed)
We have received form. I have emailed Erskine Squibb the form

## 2022-09-07 NOTE — Telephone Encounter (Signed)
Done

## 2023-01-05 ENCOUNTER — Encounter (INDEPENDENT_AMBULATORY_CARE_PROVIDER_SITE_OTHER): Payer: Self-pay

## 2024-01-21 IMAGING — CT CT CHEST W/O CM
2 of 4 series · 14 of 36 positions shown, 17 images · non-contrast
Comparison: 02/07/2022

CLINICAL DATA: Empyema



[Series 2: thorax · axial · 0.98mm/px · z∈[-155,+95]mm · 11 of 149 slices shown, 14 images]
[im 12/149  mediastinal]
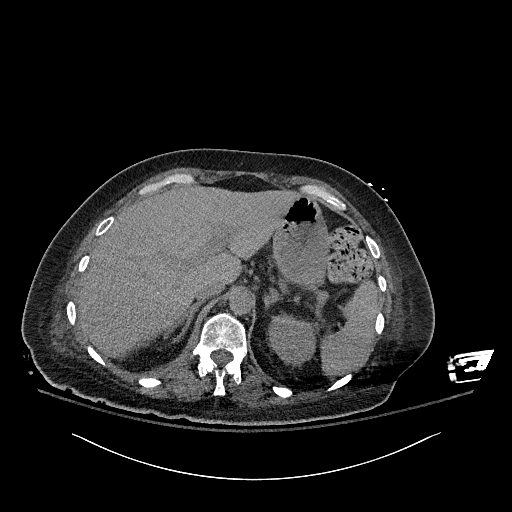
[im 12/149  lung]
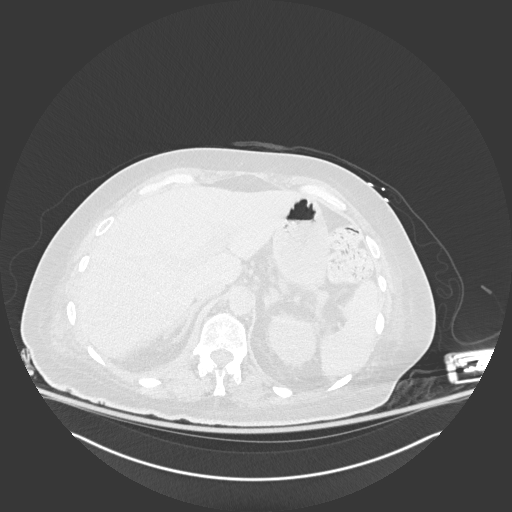
[im 23/149  lung]
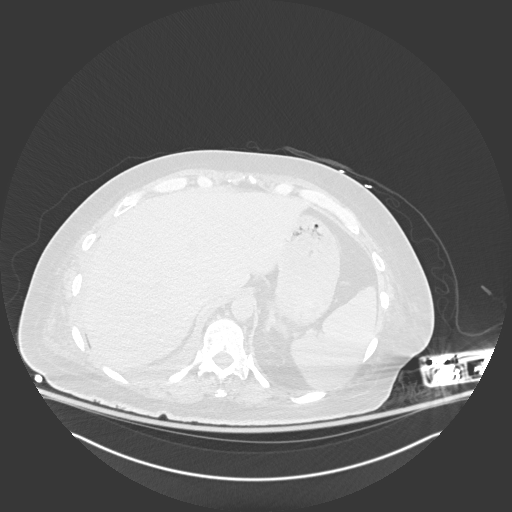
[im 35/149  lung]
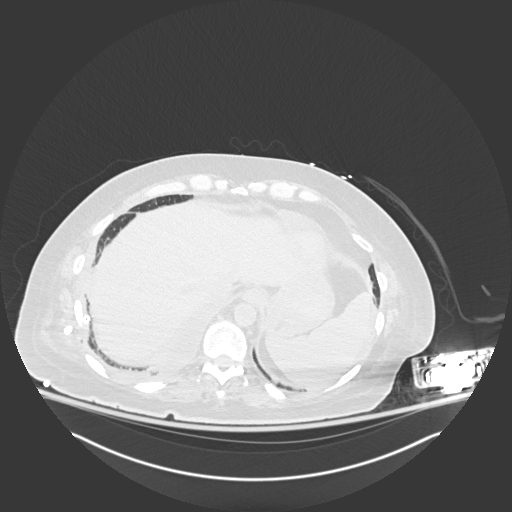
[im 46/149  lung]
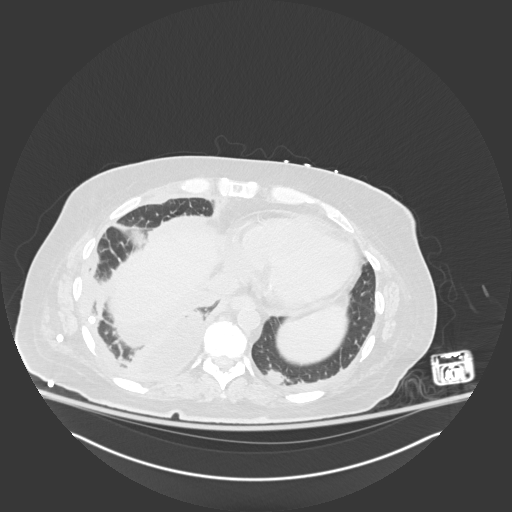
[im 57/149  mediastinal]
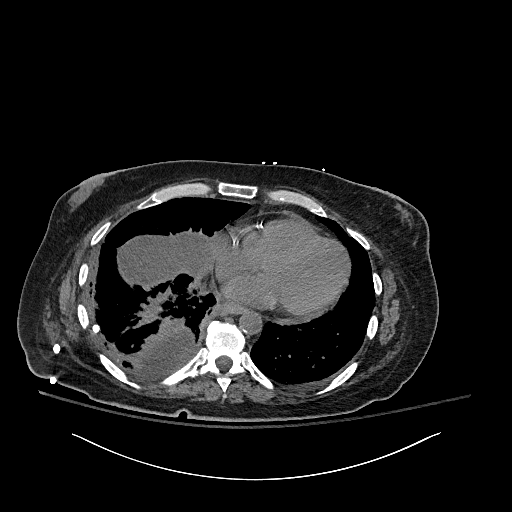
[im 57/149  lung]
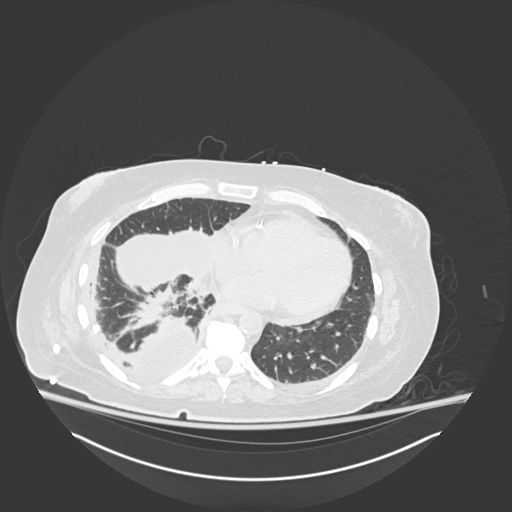
[im 80/149  lung]
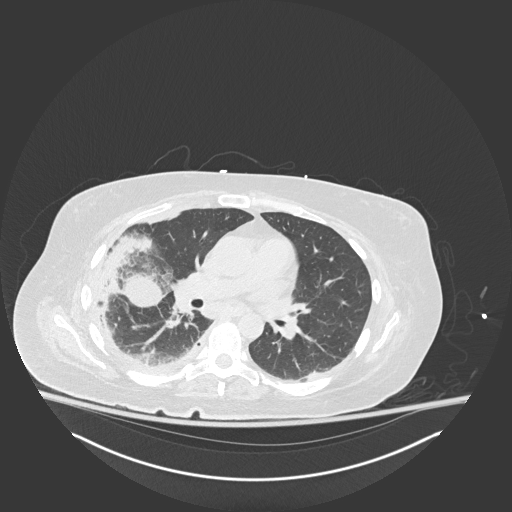
[im 92/149  lung]
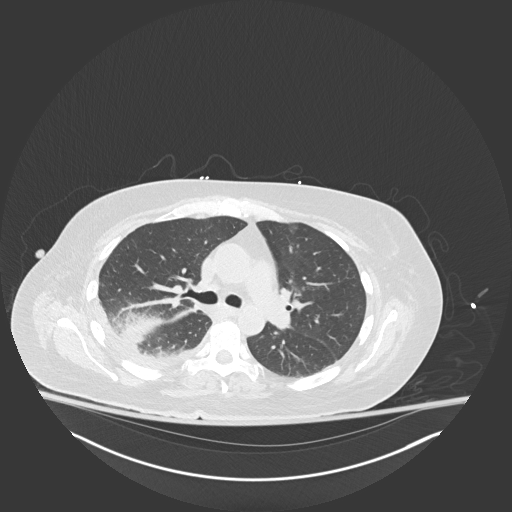
[im 103/149  lung]
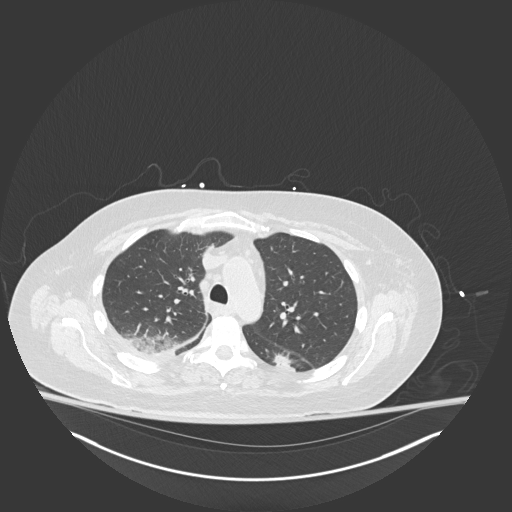
[im 114/149  mediastinal]
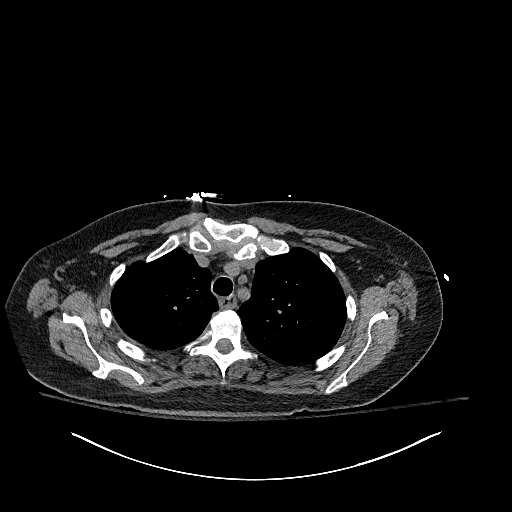
[im 114/149  lung]
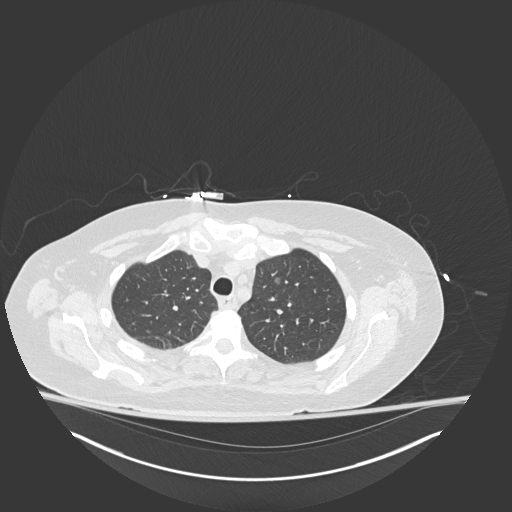
[im 126/149  lung]
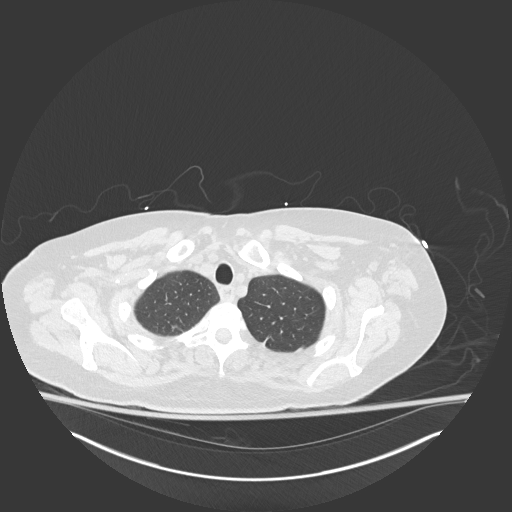
[im 137/149  lung]
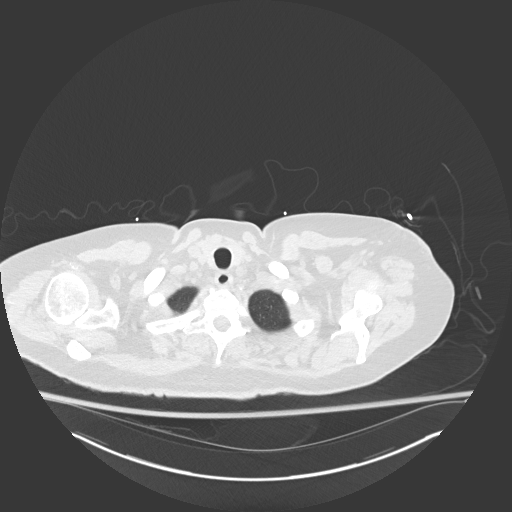

[Series 5: coronal · coronal · 0.63mm/px · 3 of 151 slices shown]
[im 31/151  lung]
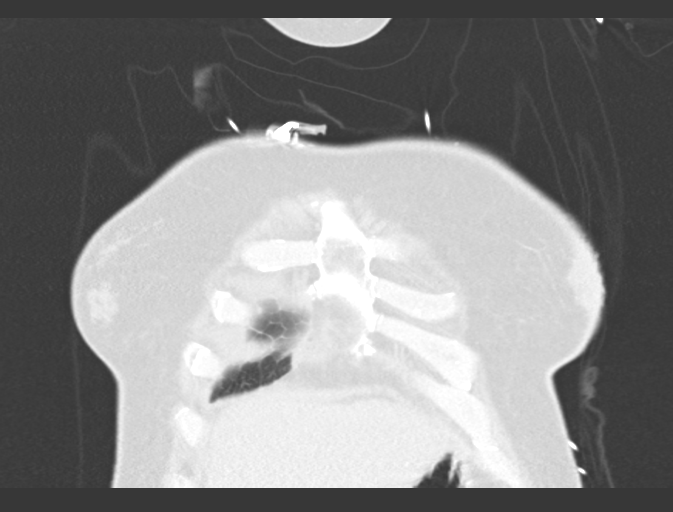
[im 61/151  lung]
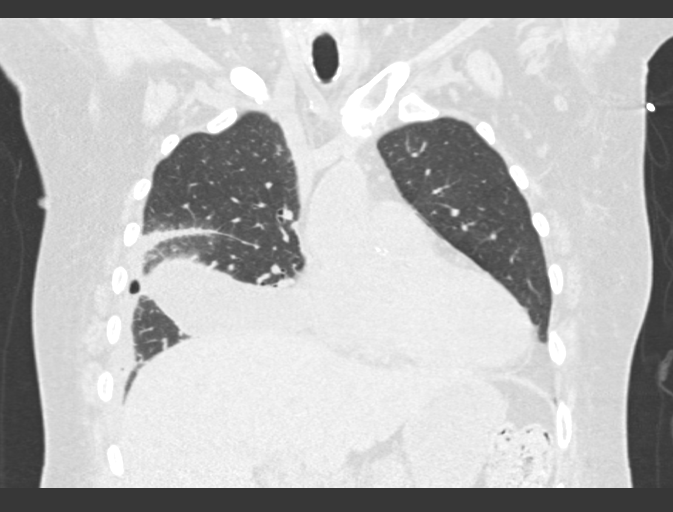
[im 91/151  lung]
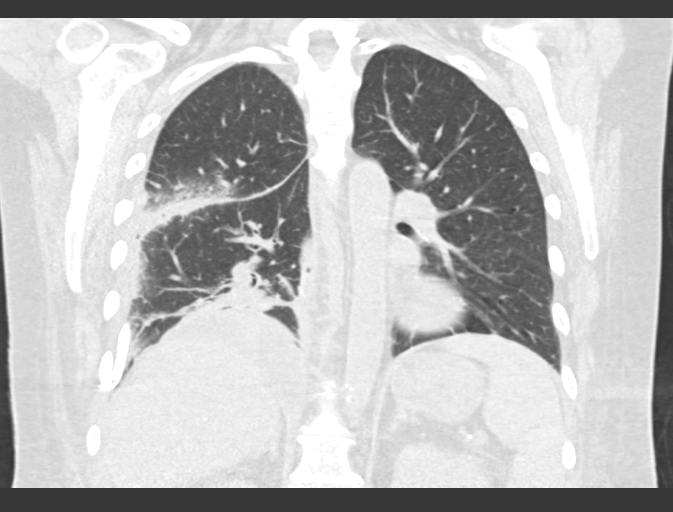

[14 of 36 positions shown; findings below may reference images not displayed]

FINDINGS: Cardiovascular: Extensive multi-vessel coronary artery
calcification. Global cardiac size within normal limits. Trace
pericardial effusion. Central pulmonary arteries are of normal
caliber. Mild atherosclerotic calcification within the thoracic
aorta. No aortic aneurysm.

Mediastinum/Nodes: Shotty right paratracheal, right hilar, and
subcarinal adenopathy is likely reactive in nature. No frankly
pathologic thoracic adenopathy. Esophagus is unremarkable. Thyroid
unremarkable.

Lungs/Pleura: Since the prior examination, a pigtail drainage
catheter has been placed within the a posterolateral aspect of the
right pleural space with its pigtail loop formed just within the
thoracic cage, partially unlooped. There has been interval
evacuation of the majority of right pleural fluid with residual gas
and pleural thickening noted. There are multiple loculated residual
components, however. Components are seen within the anterior pleural
space and sub pulmonic region. Dominant residual components are seen
within the major fissure measuring 9.3 x 9.8 x 3.7 cm and within the
medial costophrenic sulcus measuring roughly 3.1 x 5.8 x 7.2 cm.

Scattered ground-glass infiltrate is seen within the a posterior
segment of the right upper lobe, likely infectious in the acute
setting. Minimal ground-glass infiltrate is seen within the anterior
left upper lobe. Nodular consolidation within the left lower lobe is
unchanged. No pneumothorax or pleural effusion on the left.

Upper Abdomen: No acute abnormality.

Musculoskeletal: No acute bone abnormality.
IMPRESSION: Interval right pigtail chest tube placement with partial evacuation
of the right pleural effusion. Loculated components persist
anteriorly, within the sub pulmonic region, within the major
fissure, and within the posteromedial costophrenic sulcus.

Focal infiltrate within the posterior segment of the right upper
lobe, likely infectious in the acute setting. Minimal pneumonitis
within the left upper lobe also noted.

Nodular consolidation within the left lower lobe persists, not well
characterized. Short-term follow-up evaluation once the patient's
acute issues have resolved to document resolution.

Extensive multi-vessel coronary artery calcification

Aortic Atherosclerosis ([id number]).
